# Patient Record
Sex: Male | Born: 1965
Health system: Southern US, Community
[De-identification: ages and names within clinical notes are randomized; demographics above are authoritative.]

## PROBLEM LIST (undated history)

## (undated) DIAGNOSIS — C801 Malignant (primary) neoplasm, unspecified: Secondary | ICD-10-CM

## (undated) DIAGNOSIS — R519 Headache, unspecified: Secondary | ICD-10-CM

## (undated) DIAGNOSIS — R51 Headache: Secondary | ICD-10-CM

## (undated) DIAGNOSIS — I10 Essential (primary) hypertension: Secondary | ICD-10-CM

## (undated) DIAGNOSIS — F419 Anxiety disorder, unspecified: Secondary | ICD-10-CM

## (undated) DIAGNOSIS — K219 Gastro-esophageal reflux disease without esophagitis: Secondary | ICD-10-CM

## (undated) DIAGNOSIS — K449 Diaphragmatic hernia without obstruction or gangrene: Secondary | ICD-10-CM

## (undated) HISTORY — DX: Essential (primary) hypertension: I10

## (undated) HISTORY — PX: APPENDECTOMY: SHX54

---

## 2014-03-17 ENCOUNTER — Encounter (HOSPITAL_COMMUNITY): Payer: Self-pay | Admitting: *Deleted

## 2014-03-17 DIAGNOSIS — R0602 Shortness of breath: Secondary | ICD-10-CM | POA: Diagnosis not present

## 2014-03-17 DIAGNOSIS — R030 Elevated blood-pressure reading, without diagnosis of hypertension: Secondary | ICD-10-CM | POA: Insufficient documentation

## 2014-03-17 DIAGNOSIS — Z87891 Personal history of nicotine dependence: Secondary | ICD-10-CM | POA: Insufficient documentation

## 2014-03-17 DIAGNOSIS — R110 Nausea: Secondary | ICD-10-CM | POA: Diagnosis present

## 2014-03-17 DIAGNOSIS — E876 Hypokalemia: Secondary | ICD-10-CM | POA: Diagnosis not present

## 2014-03-17 NOTE — ED Notes (Signed)
Took his BP this morning and it was high.  He is not being treated for this.  He also has had some mild nausea and thinks he may have been exposed to something at work.  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

## 2014-03-18 ENCOUNTER — Emergency Department (HOSPITAL_COMMUNITY)
Admission: EM | Admit: 2014-03-18 | Discharge: 2014-03-18 | Disposition: A | Discharge status: Home / Self Care | Payer: BC Managed Care – PPO | Attending: Emergency Medicine | Admitting: Emergency Medicine

## 2014-03-18 DIAGNOSIS — E876 Hypokalemia: Secondary | ICD-10-CM

## 2014-03-18 DIAGNOSIS — IMO0001 Reserved for inherently not codable concepts without codable children: Secondary | ICD-10-CM

## 2014-03-18 DIAGNOSIS — R030 Elevated blood-pressure reading, without diagnosis of hypertension: Secondary | ICD-10-CM

## 2014-03-18 LAB — CBC WITH DIFFERENTIAL/PLATELET
Basophils Absolute: 0 10*3/uL (ref 0.0–0.1)
Basophils Relative: 0 % (ref 0–1)
Eosinophils Absolute: 0 10*3/uL (ref 0.0–0.7)
Eosinophils Relative: 0 % (ref 0–5)
HEMATOCRIT: 46 % (ref 39.0–52.0)
HEMOGLOBIN: 16.6 g/dL (ref 13.0–17.0)
LYMPHS PCT: 23 % (ref 12–46)
Lymphs Abs: 1.6 10*3/uL (ref 0.7–4.0)
MCH: 31.3 pg (ref 26.0–34.0)
MCHC: 36.1 g/dL — AB (ref 30.0–36.0)
MCV: 86.6 fL (ref 78.0–100.0)
MONOS PCT: 9 % (ref 3–12)
Monocytes Absolute: 0.6 10*3/uL (ref 0.1–1.0)
Neutro Abs: 5 10*3/uL (ref 1.7–7.7)
Neutrophils Relative %: 68 % (ref 43–77)
Platelets: 221 10*3/uL (ref 150–400)
RBC: 5.31 MIL/uL (ref 4.22–5.81)
RDW: 12.5 % (ref 11.5–15.5)
WBC: 7.3 10*3/uL (ref 4.0–10.5)

## 2014-03-18 LAB — COMPREHENSIVE METABOLIC PANEL
ALT: 44 U/L (ref 0–53)
ANION GAP: 9 (ref 5–15)
AST: 45 U/L — ABNORMAL HIGH (ref 0–37)
Albumin: 4.2 g/dL (ref 3.5–5.2)
Alkaline Phosphatase: 83 U/L (ref 39–117)
BILIRUBIN TOTAL: 0.6 mg/dL (ref 0.3–1.2)
BUN: 16 mg/dL (ref 6–23)
CHLORIDE: 102 meq/L (ref 96–112)
CO2: 25 mmol/L (ref 19–32)
CREATININE: 1.01 mg/dL (ref 0.50–1.35)
Calcium: 9.3 mg/dL (ref 8.4–10.5)
GFR calc non Af Amer: 85 mL/min — ABNORMAL LOW (ref >90)
Glucose, Bld: 114 mg/dL — ABNORMAL HIGH (ref 70–99)
Potassium: 3.3 mmol/L — ABNORMAL LOW (ref 3.5–5.1)
Sodium: 136 mmol/L (ref 135–145)
Total Protein: 8.9 g/dL — ABNORMAL HIGH (ref 6.0–8.3)

## 2014-03-18 MED ORDER — POTASSIUM CHLORIDE CRYS ER 20 MEQ PO TBCR
40.0000 meq | EXTENDED_RELEASE_TABLET | Freq: Once | ORAL | Status: AC
  Administered 2014-03-18: 40 meq via ORAL
  Filled 2014-03-18: qty 2

## 2014-03-18 MED ORDER — LISINOPRIL 5 MG PO TABS
5.0000 mg | ORAL_TABLET | Freq: Once | ORAL | Status: AC
  Administered 2014-03-18: 5 mg via ORAL
  Filled 2014-03-18: qty 1

## 2014-03-18 MED ORDER — LISINOPRIL 5 MG PO TABS: 5.0000 mg | ORAL_TABLET | Freq: Every day | ORAL | Status: DC

## 2014-03-18 NOTE — Discharge Instructions (Signed)
You need to establish care with a primary doctor to monitor your blood pressure. Return immediately for persistent headache, visual changes, focal weakness, persistent vomiting, fever or for any concerns.   Hypertension Hypertension, commonly called high blood pressure, is when the force of blood pumping through your arteries is too strong. Your arteries are the blood vessels that carry blood from your heart throughout your body. A blood pressure reading consists of a higher number over a lower number, such as 110/72. The higher number (systolic) is the pressure inside your arteries when your heart pumps. The lower number (diastolic) is the pressure inside your arteries when your heart relaxes. Ideally you want your blood pressure below 120/80. Hypertension forces your heart to work harder to pump blood. Your arteries may become narrow or stiff. Having hypertension puts you at risk for heart disease, stroke, and other problems.  RISK FACTORS Some risk factors for high blood pressure are controllable. Others are not.  Risk factors you cannot control include:   Race. You may be at higher risk if you are African American.  Age. Risk increases with age.  Gender. Men are at higher risk than women before age 65 years. After age 96, women are at higher risk than men. Risk factors you can control include:  Not getting enough exercise or physical activity.  Being overweight.  Getting too much fat, sugar, calories, or salt in your diet.  Drinking too much alcohol. SIGNS AND SYMPTOMS Hypertension does not usually cause signs or symptoms. Extremely high blood pressure (hypertensive crisis) may cause headache, anxiety, shortness of breath, and nosebleed. DIAGNOSIS  To check if you have hypertension, your health care provider will measure your blood pressure while you are seated, with your arm held at the level of your heart. It should be measured at least twice using the same arm. Certain conditions  can cause a difference in blood pressure between your right and left arms. A blood pressure reading that is higher than normal on one occasion does not mean that you need treatment. If one blood pressure reading is high, ask your health care provider about having it checked again. TREATMENT  Treating high blood pressure includes making lifestyle changes and possibly taking medicine. Living a healthy lifestyle can help lower high blood pressure. You may need to change some of your habits. Lifestyle changes may include:  Following the DASH diet. This diet is high in fruits, vegetables, and whole grains. It is low in salt, red meat, and added sugars.  Getting at least 2 hours of brisk physical activity every week.  Losing weight if necessary.  Not smoking.  Limiting alcoholic beverages.  Learning ways to reduce stress. If lifestyle changes are not enough to get your blood pressure under control, your health care provider may prescribe medicine. You may need to take more than one. Work closely with your health care provider to understand the risks and benefits. HOME CARE INSTRUCTIONS  Have your blood pressure rechecked as directed by your health care provider.   Take medicines only as directed by your health care provider. Follow the directions carefully. Blood pressure medicines must be taken as prescribed. The medicine does not work as well when you skip doses. Skipping doses also puts you at risk for problems.   Do not smoke.   Monitor your blood pressure at home as directed by your health care provider. SEEK MEDICAL CARE IF:   You think you are having a reaction to medicines taken.  You have  recurrent headaches or feel dizzy.  You have swelling in your ankles.  You have trouble with your vision. SEEK IMMEDIATE MEDICAL CARE IF:  You develop a severe headache or confusion.  You have unusual weakness, numbness, or feel faint.  You have severe chest or abdominal  pain.  You vomit repeatedly.  You have trouble breathing. MAKE SURE YOU:   Understand these instructions.  Will watch your condition.  Will get help right away if you are not doing well or get worse. Document Released: 03/09/2005 Document Revised: 07/24/2013 Document Reviewed: 12/30/2012 [facility] Patient Information 2015 [place], [place]. This information is not intended to replace advice given to you by your health care provider. Make sure you discuss any questions you have with your health care provider.  Emergency Department Resource Guide 1) Find a Doctor and Pay Out of Pocket Although you won't have to find out who is covered by your insurance plan, it is a good idea to ask around and get recommendations. You will then need to call the office and see if the doctor you have chosen will accept you as a new patient and what types of options they offer for patients who are self-pay. Some doctors offer discounts or will set up payment plans for their patients who do not have insurance, but you will need to ask so you aren't surprised when you get to your appointment.  2) Contact Your Local Health Department Not all health departments have doctors that can see patients for sick visits, but many do, so it is worth a call to see if yours does. If you don't know where your local health department is, you can check in your phone book. The CDC also has a tool to help you locate your state's health department, and many state websites also have listings of all of their local health departments.  3) Find a [facility] If your illness is not likely to be very severe or complicated, you may want to try a walk in clinic. These are popping up all over the country in pharmacies, drugstores, and shopping centers. They're usually staffed by nurse practitioners or physician assistants that have been trained to treat common illnesses and complaints. They're usually fairly quick and inexpensive. However, if  you have serious medical issues or chronic medical problems, these are probably not your best option.  No Primary Care Doctor: - Call Health Connect at  [phone] - they can help you locate a primary care doctor that  accepts your insurance, provides certain services, etc. - Physician Referral Service- [phone]  Chronic Pain Problems: Organization         Address  Phone   Notes  [facility]  [phone] Patients need to be referred by their primary care doctor.   Medication Assistance: Organization         Address  Phone   Notes  [facility] Medication [facility] [address] [place], [place] [address] [phone] --Must be a resident of [facility] -- Must have NO insurance coverage whatsoever (no Medicaid/ Medicare, etc.) -- The pt. MUST have a primary care doctor that directs their care regularly and follows them in the community   [facility]ist  [phone]   [employer]  [phone]    Agencies that provide inexpensive medical care: Organization         Address  Phone   Notes  [place]  [phone]   [facility] Pontes  Internal Medicine    [phone]   [facility], [place] [address] [phone]   [place] [address] [place] [phone]   Planned Parenthood    [phone]   [facility]    [phone]   [place] [address], [place] Phone:  [phone], Fax:  [phone] Hours of Operation:  9 am - 6 pm, M-F.  Also accepts Medicaid/Medicare and self-pay.  [facility] for [place] [address], [address], [place] Phone: [phone], Fax: [phone]. Hours of Operation:  8:30 am - 5:30 pm, M-F.  Also accepts Medicaid and self-pay.  [facility] [address], [place] Phone: [phone]   [place], [place], [place] [phone], Ext. [phone] Mondays & Thursdays: 7-9 AM.  First 15 patients are seen on a first come, first serve basis.    [place] Providers:  Organization         Address  Phone   Notes  [facility] [address], [place] [phone] Also accepts self-pay patients.  [facility] [address], [place]  [phone]   [place], [address], [place] [phone]   [facility] [address] [place] [phone]   Lucianne Lei [address], [place]   [phone] Only accepts [place] Access [place] patients after they have their name applied to their card.   Self-Pay (no insurance) in [facility]:  Organization         Address  Phone   Notes  Sickle Cell Patients, [facility] [place] [phone]   [facility] [place] [phone]   [facility] [place]  [place], [place], [place] [phone]   [facility]/Dr. Osei-Bonsu  [address] [place] or [address], [place] [phone] Phone number for both [place] and Trail locations is the same.  [facility]gent [facility] and [facility] [address], [place] [phone]   [facility] [address], [place] or [address] [phone]   [facility] [address] [place] [phone], phone; [phone], fax Sees patients 1st and 3rd Saturday of every month.  Must not qualify for public or private insurance (i.e. Medicaid, Medicare, [place] Health Choice, Veterans' Benefits)  Household income should be no more than 200% of the poverty level The clinic cannot treat you if you are pregnant or think you are  pregnant  Sexually transmitted diseases are not treated at the clinic.    Dental Care: Organization         Address  Phone  Notes  [facility] Department of [facility] [place] [phone] Accepts children up to age 1 who are enrolled in [place] or [place]; pregnant women with a Medicaid card; and children who have applied for Medicaid or [place] Health Choice, but were declined, whose parents can pay a reduced fee at time of service.  [facility] Department of [facility]  [address], [employer] (  [phone] Accepts children up to age 23 who are enrolled in Medicaid or [place]; pregnant women with a Medicaid card; and children who have applied for Medicaid or [place] Health Choice, but were declined, whose parents can pay a reduced fee at time of service.  [place] Adult [facility] Access PROGRAM  [place] [phone] Patients are seen by appointment only. Walk-ins are not accepted. [place] will see patients 59 years of age and older. Monday - Tuesday (8am-5pm) Most Wednesdays (8:30-5pm) $30 per visit, cash only  [facility] Adult Dental Access PROGRAM  [address], [facility] [phone] Patients are seen by appointment only. Walk-ins are not accepted. [place] will see patients 65 years of age and older. One Wednesday Evening (Monthly: Volunteer Based).  $30 per visit, cash only  [facility]  [phone] for adults; Children under age 42, call Graduate Pediatric Dentistry at [phone]. Children aged 10-14, please call [phone] to request a pediatric application.  Dental services are provided in all areas of dental care including fillings, crowns and bridges, complete and partial dentures, implants, gum treatment, root canals, and extractions. Preventive care is also provided. Treatment is provided to both adults and  children. Patients are selected via a lottery and there is often a waiting list.   [facility] [address] [place]  [phone] [email or web address]   [facility] [address] [place], [place] [phone], Ext. [phone] Second and Fourth Thursday of each month, opens at 6:30 AM; Clinic ends at 9 AM.  Patients are seen on a first-come first-served basis, and a limited number are seen during each clinic.   [facility]  [address] Hill[place], [place] [phone]   Eligibility Requirements You must have lived in [place], [place], or [place] counties for at least the last three months.   You cannot be eligible for state or federal sponsored [employer], including [employer], [place], or [employer].   You generally cannot be eligible for healthcare insurance through your employer.    How to apply: Eligibility screenings are held every Tuesday and Wednesday afternoon from 1:00 pm until 4:00 pm. You do not need an appointment for the interview!  [facility] [address] [place], [place]   [place]  [place] Department  [place]  [phone]    Behavioral Health Resources in the Community: Intensive Outpatient Programs Organization         Address  Phone  Notes  [place] [place]. [address] [place], [place] [phone]   [facility] Outpatient [address], [place], [place]   ADS: Alcohol & Drug Svcs [address] [place], [place]   [place] [address],  [place], [place] or [phone]   Substance Abuse Resources Organization         Address  Phone  Notes  Alcohol and Drug Services  [phone]   [place]  [phone]   [facility] [place]    Chinita Pester  [phone]   Residential & Outpatient Substance Abuse Program  [phone]   Psychological Services Organization         Address  Phone  Notes  [place]  [place]  [phone]  [place] [address] [place] or [phone]    Mobile Crisis Teams Organization         Address  Phone  Notes  [facility], Mobile Crisis Care Unit  [phone]   [facility]  [address] [place], [place]   [place] [address] [place] [phone]    Self-Help/Support Groups Organization         Address  Phone             Notes  [place]. of [place] - variety of support groups  [place] Call for more information  Narcotics Anonymous (NA), [facility] [address], [employer] [place]  2 meetings at this location   Special educational needs teacher         Address  Phone  Notes  [facility]idential [facility] [place],    [place]  [phone]   [facility]  [address] [place] [phone], [place], [place]   [place], [place] [phone] Admissions: 8am-3pm M-F  Incentives Substance [place] [address]    [place], [place] [phone]   [facility] [address] [place], [place], [place]   [facility] [address]  [place], [place]   [facility] - Intensive Outpatient [address] [place]een [address], [place], [place]   [facility] ([place].,  [place], [place] [phone] or [phone]   [facility] (RTS) [address], [place], [place] Accepts Medicaid  [facility] [place] [address]  [place] [phone] Substance Abuse/Addiction Treatment   [facility] Organization         Address  Phone  Notes  [facility]  [phone]   Domenic Schwab, PhD [address] [place], [place]   [phone] or [phone]   [place], [place] [phone]   [facility] [address] [place], [place] [phone] Insurance/Medicaid/sponsorship through [facility] and Families [address]                                    [place], [place] [phone] [place] [address] [place], [place] [phone]    Dr. Adele Schilder  [phone]   [facility] Dept. 1) [address] [place] 2) [place] 3)  [place] [address], [place] [phone]  [phone]   [place] [phone] or [phone] (After Hours)

## 2014-03-18 NOTE — ED Provider Notes (Signed)
CSN: [id number]     Arrival date & time 03/17/14  2147 History   First MD Initiated Contact with Patient 03/18/14 0104     Chief Complaint  Patient presents with  . Hypertension  . Nausea     (Consider location/radiation/quality/duration/timing/severity/associated sxs/prior Treatment) HPI Patient presents with episodic nausea. No vomiting or diarrhea. States he took his blood pressure this morning and it was elevated. Denies headache, visual changes, focal weakness or numbness. He is on no medication for blood pressure control. He currently has no nausea. He denies chest pain or shortness of breath. History reviewed. No pertinent past medical history. History reviewed. No pertinent past surgical history. History reviewed. No pertinent family history. History  Substance Use Topics  . Smoking status: Former Research scientist (life sciences)  . Smokeless tobacco: Not on file  . Alcohol Use: No    Review of Systems  Constitutional: Negative for fever and chills.  HENT: Negative for congestion.   Eyes: Negative for visual disturbance.  Respiratory: Positive for shortness of breath. Negative for cough.   Cardiovascular: Negative for chest pain, palpitations and leg swelling.  Gastrointestinal: Positive for nausea. Negative for vomiting, abdominal pain, diarrhea and constipation.  Musculoskeletal: Negative for back pain, neck pain and neck stiffness.  Skin: Negative for rash and wound.  Neurological: Negative for dizziness, weakness, light-headedness, numbness and headaches.  All other systems reviewed and are negative.     Allergies  Review of patient's allergies indicates no known allergies.  Home Medications   Prior to Admission medications   Medication Sig Start Date End Date Taking? Authorizing Provider  lisinopril (PRINIVIL,ZESTRIL) 5 MG tablet Take 1 tablet (5 mg total) by mouth daily. 03/18/14   Julianne Rice, MD   BP 172/110 mmHg  Pulse 99  Temp(Src) 98.2 F (36.8 C) (Oral)  Resp 16  Ht  5\' 9"  (1.753 m)  Wt 160 lb (72.576 kg)  BMI 23.62 kg/m2  SpO2 100% Physical Exam  Constitutional: He is oriented to person, place, and time. He appears well-developed and well-nourished. No distress.  HENT:  Head: Normocephalic and atraumatic.  Mouth/Throat: Oropharynx is clear and moist.  Eyes: EOM are normal. Pupils are equal, round, and reactive to light.  Neck: Normal range of motion. Neck supple.  Cardiovascular: Normal rate and regular rhythm.   Pulmonary/Chest: Effort normal and breath sounds normal. No respiratory distress. He has no wheezes. He has no rales. He exhibits no tenderness.  Abdominal: Soft. Bowel sounds are normal. He exhibits no distension and no mass. There is no tenderness. There is no rebound and no guarding.  Musculoskeletal: Normal range of motion. He exhibits no edema or tenderness.  Neurological: He is alert and oriented to person, place, and time.  Patient is alert and oriented x3 with clear, goal oriented speech. Patient has 5/5 motor in all extremities. Sensation is intact to light touch. Bilateral finger-to-nose is normal with no signs of dysmetria. Patient has a normal gait and walks without assistance.  Skin: Skin is warm and dry. No rash noted. No erythema.  Psychiatric: He has a normal mood and affect. His behavior is normal.  Nursing note and vitals reviewed.   ED Course  Procedures (including critical care time) Labs Review Labs Reviewed  CBC WITH DIFFERENTIAL - Abnormal; Notable for the following:    MCHC 36.1 (*)    All other components within normal limits  COMPREHENSIVE METABOLIC PANEL - Abnormal; Notable for the following:    Potassium 3.3 (*)    Glucose, Bld 114 (*)  Total Protein 8.9 (*)    AST 45 (*)    GFR calc non Af Amer 85 (*)    All other components within normal limits    Imaging Review No results found.   EKG Interpretation None      MDM   Final diagnoses:  Elevated blood pressure  Hypokalemia    Mild  hypokalemia. Labs otherwise unremarkable. We'll start on low-dose lisinopril and have advised he establish care with primary doctor. He's been given return precautions and is voiced understanding.    Julianne Rice, MD 03/18/14 [phone]

## 2014-08-09 ENCOUNTER — Emergency Department (HOSPITAL_COMMUNITY)
Admission: EM | Admit: 2014-08-09 | Discharge: 2014-08-09 | Disposition: A | Discharge status: Home / Self Care | Payer: BLUE CROSS/BLUE SHIELD | Attending: Emergency Medicine | Admitting: Emergency Medicine

## 2014-08-09 ENCOUNTER — Emergency Department (HOSPITAL_COMMUNITY): Payer: BLUE CROSS/BLUE SHIELD

## 2014-08-09 ENCOUNTER — Encounter (HOSPITAL_COMMUNITY): Payer: Self-pay | Admitting: *Deleted

## 2014-08-09 DIAGNOSIS — R1013 Epigastric pain: Secondary | ICD-10-CM | POA: Diagnosis present

## 2014-08-09 DIAGNOSIS — Z79899 Other long term (current) drug therapy: Secondary | ICD-10-CM | POA: Diagnosis not present

## 2014-08-09 DIAGNOSIS — Z87891 Personal history of nicotine dependence: Secondary | ICD-10-CM | POA: Diagnosis not present

## 2014-08-09 DIAGNOSIS — N200 Calculus of kidney: Secondary | ICD-10-CM | POA: Insufficient documentation

## 2014-08-09 DIAGNOSIS — I10 Essential (primary) hypertension: Secondary | ICD-10-CM | POA: Diagnosis not present

## 2014-08-09 DIAGNOSIS — R109 Unspecified abdominal pain: Secondary | ICD-10-CM

## 2014-08-09 LAB — COMPREHENSIVE METABOLIC PANEL
ALT: 65 U/L — ABNORMAL HIGH (ref 17–63)
ANION GAP: 10 (ref 5–15)
AST: 52 U/L — AB (ref 15–41)
Albumin: 4.4 g/dL (ref 3.5–5.0)
Alkaline Phosphatase: 71 U/L (ref 38–126)
BILIRUBIN TOTAL: 0.7 mg/dL (ref 0.3–1.2)
BUN: 9 mg/dL (ref 6–20)
CALCIUM: 9.4 mg/dL (ref 8.9–10.3)
CO2: 24 mmol/L (ref 22–32)
CREATININE: 1.06 mg/dL (ref 0.61–1.24)
Chloride: 104 mmol/L (ref 101–111)
GFR calc non Af Amer: >60 mL/min (ref >60)
GLUCOSE: 140 mg/dL — AB (ref 65–99)
Potassium: 4 mmol/L (ref 3.5–5.1)
Sodium: 138 mmol/L (ref 135–145)
Total Protein: 8.7 g/dL — ABNORMAL HIGH (ref 6.5–8.1)

## 2014-08-09 LAB — CBC WITH DIFFERENTIAL/PLATELET
Basophils Absolute: 0 10*3/uL (ref 0.0–0.1)
Basophils Relative: 0 % (ref 0–1)
Eosinophils Absolute: 0 10*3/uL (ref 0.0–0.7)
Eosinophils Relative: 0 % (ref 0–5)
HCT: 47.2 % (ref 39.0–52.0)
Hemoglobin: 16.9 g/dL (ref 13.0–17.0)
LYMPHS ABS: 1 10*3/uL (ref 0.7–4.0)
LYMPHS PCT: 20 % (ref 12–46)
MCH: 31.2 pg (ref 26.0–34.0)
MCHC: 35.8 g/dL (ref 30.0–36.0)
MCV: 87.1 fL (ref 78.0–100.0)
MONO ABS: 0.3 10*3/uL (ref 0.1–1.0)
Monocytes Relative: 6 % (ref 3–12)
Neutro Abs: 3.8 10*3/uL (ref 1.7–7.7)
Neutrophils Relative %: 74 % (ref 43–77)
Platelets: 232 10*3/uL (ref 150–400)
RBC: 5.42 MIL/uL (ref 4.22–5.81)
RDW: 12.3 % (ref 11.5–15.5)
WBC: 5.2 10*3/uL (ref 4.0–10.5)

## 2014-08-09 LAB — LIPASE, BLOOD: Lipase: 29 U/L (ref 22–51)

## 2014-08-09 MED ORDER — FAMOTIDINE 20 MG PO TABS
20.0000 mg | ORAL_TABLET | Freq: Once | ORAL | Status: AC
  Administered 2014-08-09: 20 mg via ORAL
  Filled 2014-08-09: qty 1

## 2014-08-09 MED ORDER — GI COCKTAIL ~~LOC~~
30.0000 mL | Freq: Once | ORAL | Status: AC
  Administered 2014-08-09: 30 mL via ORAL
  Filled 2014-08-09: qty 30

## 2014-08-09 MED ORDER — SUCRALFATE 1 G PO TABS: 1.0000 g | ORAL_TABLET | Freq: Three times a day (TID) | ORAL | Status: DC

## 2014-08-09 MED ORDER — RANITIDINE HCL 150 MG PO CAPS: 150.0000 mg | ORAL_CAPSULE | Freq: Two times a day (BID) | ORAL | Status: DC

## 2014-08-09 NOTE — ED Notes (Signed)
Pt back in room.

## 2014-08-09 NOTE — ED Provider Notes (Signed)
CSN: [id number]     Arrival date & time 08/09/14  1255 History   First MD Initiated Contact with Patient 08/09/14 1523     Chief Complaint  Patient presents with  . Abdominal Pain     (Consider location/radiation/quality/duration/timing/severity/associated sxs/prior Treatment) HPI Comments: The patient is a 49 year old male, who presents to the hospital with a complaint of approximately 1 week of abdominal bloating and nausea. He denies any significant pain but feels uncomfortable in the epigastrium with a feeling that radiates up into his neck, worse when he lays down, worse at night and in the morning, better when he stands up and walks. He does not have any pain that radiates to his back, he does not have any swelling of the lower extremities and denies fevers chills diarrhea rashes numbness weakness chest pain coughing or shortness of breath. He has a history of using ibuprofen frequently, currently he is using approximately 4-800 mg of ibuprofen once or twice a day. He does not take any medications for his stomach other than Pepto-Bismol when he feels bad which she states makes her feel better. He has never had any abdominal surgery  Patient is a 49 y.o. male presenting with abdominal pain. The history is provided by the patient.  Abdominal Pain   Past Medical History  Diagnosis Date  . Hypertension    Past Surgical History  Procedure Laterality Date  . Appendectomy     Family History  Problem Relation Age of Onset  . Hypertension Mother   . Hypertension Father    History  Substance Use Topics  . Smoking status: Former Research scientist (life sciences)  . Smokeless tobacco: Not on file  . Alcohol Use: No    Review of Systems  Gastrointestinal: Positive for abdominal pain.  All other systems reviewed and are negative.     Allergies  Hydrocodone  Home Medications   Prior to Admission medications   Medication Sig Start Date End Date Taking? Authorizing Provider  bismuth subsalicylate (PEPTO  BISMOL) 262 MG/15ML suspension Take 30 mLs by mouth every 6 (six) hours as needed for diarrhea or loose stools.   Yes Historical Provider, MD  ibuprofen (ADVIL,MOTRIN) 200 MG tablet Take 200 mg by mouth every 6 (six) hours as needed for mild pain or moderate pain.   Yes Historical Provider, MD  Ibuprofen-Diphenhydramine HCl 200-25 MG CAPS Take 1-2 capsules by mouth once as needed (for pain/sleep).   Yes Historical Provider, MD  lisinopril (PRINIVIL,ZESTRIL) 10 MG tablet Take 10 mg by mouth daily. 08/07/14  Yes Historical Provider, MD  ranitidine (ZANTAC) 150 MG capsule Take 1 capsule (150 mg total) by mouth 2 (two) times daily. 08/09/14   Noemi Chapel, MD  sucralfate (CARAFATE) 1 G tablet Take 1 tablet (1 g total) by mouth 4 (four) times daily -  with meals and at bedtime. 08/09/14   Noemi Chapel, MD   BP 146/100 mmHg  Pulse 92  Temp(Src) 98 F (36.7 C) (Oral)  Resp 14  Ht 5\' 9"  (1.753 m)  Wt 155 lb (70.308 kg)  BMI 22.88 kg/m2  SpO2 98% Physical Exam  Constitutional: He appears well-developed and well-nourished. No distress.  HENT:  Head: Normocephalic and atraumatic.  Mouth/Throat: Oropharynx is clear and moist. No oropharyngeal exudate.  Eyes: Conjunctivae and EOM are normal. Pupils are equal, round, and reactive to light. Right eye exhibits no discharge. Left eye exhibits no discharge. No scleral icterus.  Neck: Normal range of motion. Neck supple. No JVD present. No thyromegaly present.  Cardiovascular: Normal rate, regular rhythm, normal heart sounds and intact distal pulses.  Exam reveals no gallop and no friction rub.   No murmur heard. Pulmonary/Chest: Effort normal and breath sounds normal. No respiratory distress. He has no wheezes. He has no rales.  Abdominal: Soft. Bowel sounds are normal. He exhibits no distension and no mass. There is no tenderness.  No abdominal tenderness, no tympanitic sounds to percussion, no abdominal distention  Musculoskeletal: Normal range of motion.  He exhibits no edema or tenderness.  Lymphadenopathy:    He has no cervical adenopathy.  Neurological: He is alert. Coordination normal.  Skin: Skin is warm and dry. No rash noted. No erythema.  Psychiatric: He has a normal mood and affect. His behavior is normal.  Nursing note and vitals reviewed.   ED Course  Procedures (including critical care time) Labs Review Labs Reviewed  COMPREHENSIVE METABOLIC PANEL - Abnormal; Notable for the following:    Glucose, Bld 140 (*)    Total Protein 8.7 (*)    AST 52 (*)    ALT 65 (*)    All other components within normal limits  CBC WITH DIFFERENTIAL/PLATELET  LIPASE, BLOOD    Imaging Review Dg Abd 2 Views  08/09/2014   CLINICAL DATA:  Mid upper abdominal pain for 5 days.  EXAM: ABDOMEN - 2 VIEW  COMPARISON:  None.  FINDINGS: Negative for free air on the upright view. Nonobstructive bowel gas pattern with gas and stool in the colon. There is a cluster of calcifications in the right abdomen which probably represents gallstones. Calcifications are also in the region of the right kidney. This calcification cluster measures up to 1.8 cm.  IMPRESSION: Cluster of calcifications in the right abdomen, probably representing gallstones.  Nonobstructive bowel gas pattern. Moderate amount of stool in the right colon.   Electronically Signed   By: Markus Daft M.D.   On: 08/09/2014 16:49   US Abdomen Limited Ruq  08/09/2014   CLINICAL DATA:  Abdominal pain for 5 days. Possible gallstones seen on radiograph.  EXAM: US ABDOMEN LIMITED - RIGHT UPPER QUADRANT  COMPARISON:  Abdominal radiograph - earlier same day  FINDINGS: Gallbladder:  Sonographically normal. No echogenic gallstones or gall sludge. No gallbladder wall thickening or pericholecystic fluid. Negative sonographic Murphy's sign.  Common bile duct:  Diameter: Normal in size measuring 2.3 mm in diameter  Liver:  Homogeneous hepatic echotexture. No discrete hepatic lesions. No definite evidence of intrahepatic  biliary ductal dilatation. No ascites.  Instilling noted is an approximately 2.0 x 1.7 x 1.2 cm echogenic shadowing stone within the incidentally imaged superior/mid aspect of the right kidney (images 21 and 39).  IMPRESSION: 1. No evidence of cholelithiasis. 2. Suspected approximately 2.0 cm echogenic shadowing stone within the superior/mid aspect of the right kidney - this stone correlates with the stone seen on preceding abdominal radiograph. Further evaluation with noncontrast abdominal CT could be performed as clinically indicated.   Electronically Signed   By: Sandi Mariscal M.D.   On: 08/09/2014 17:56      MDM   Final diagnoses:  Abdominal pain  Dyspepsia  Kidney stone on right side    The patient's blood work shows a slight elevation in his transaminases, no leukocytosis, and lipase, 2 view abdomen, GI cocktail as this does sound to be more acid-related, dyspepsia with acid reflux most likely answer. Discouraged use of NSAIDs, reevaluate after labs have resulted.  He also reports using magnesium citrate to move his bowels recently -  no constipation.   Labs unremarkable  xrays reviewed, [place] reviewed, no acute findings - gas in bowels, no obstruction, no sig stool collection  Stable for d/c.  Meds given in ED:  Medications  gi cocktail (Maalox,Lidocaine,Donnatal) (30 mLs Oral Given 08/09/14 1551)  famotidine (PEPCID) tablet 20 mg (20 mg Oral Given 08/09/14 1550)    New Prescriptions   RANITIDINE (ZANTAC) 150 MG CAPSULE    Take 1 capsule (150 mg total) by mouth 2 (two) times daily.   SUCRALFATE (CARAFATE) 1 G TABLET    Take 1 tablet (1 g total) by mouth 4 (four) times daily -  with meals and at bedtime.      Noemi Chapel, MD 08/09/14 [id number]

## 2014-08-09 NOTE — Discharge Instructions (Signed)
Please call your doctor for a followup appointment within 24-48 hours. When you talk to your doctor please let them know that you were seen in the emergency department and have them acquire all of your records so that they can discuss the findings with you and formulate a treatment plan to fully care for your new and ongoing problems. ° °

## 2014-08-09 NOTE — ED Notes (Signed)
abd pain, nausea, difficulty swallowing.

## 2014-08-22 ENCOUNTER — Emergency Department (HOSPITAL_COMMUNITY)
Admission: EM | Admit: 2014-08-22 | Discharge: 2014-08-22 | Disposition: A | Discharge status: Home / Self Care | Payer: BLUE CROSS/BLUE SHIELD | Attending: Emergency Medicine | Admitting: Emergency Medicine

## 2014-08-22 ENCOUNTER — Emergency Department (HOSPITAL_COMMUNITY)
Admission: EM | Admit: 2014-08-22 | Discharge: 2014-08-23 | Disposition: A | Discharge status: Home / Self Care | Payer: BLUE CROSS/BLUE SHIELD | Attending: Emergency Medicine | Admitting: Emergency Medicine

## 2014-08-22 ENCOUNTER — Encounter (HOSPITAL_COMMUNITY): Payer: Self-pay | Admitting: Emergency Medicine

## 2014-08-22 ENCOUNTER — Encounter (HOSPITAL_COMMUNITY): Payer: Self-pay | Admitting: *Deleted

## 2014-08-22 ENCOUNTER — Emergency Department (HOSPITAL_COMMUNITY): Payer: BLUE CROSS/BLUE SHIELD

## 2014-08-22 DIAGNOSIS — R1013 Epigastric pain: Secondary | ICD-10-CM | POA: Diagnosis present

## 2014-08-22 DIAGNOSIS — K2970 Gastritis, unspecified, without bleeding: Secondary | ICD-10-CM | POA: Diagnosis not present

## 2014-08-22 DIAGNOSIS — R1010 Upper abdominal pain, unspecified: Secondary | ICD-10-CM | POA: Diagnosis present

## 2014-08-22 DIAGNOSIS — I10 Essential (primary) hypertension: Secondary | ICD-10-CM | POA: Diagnosis not present

## 2014-08-22 DIAGNOSIS — Z87891 Personal history of nicotine dependence: Secondary | ICD-10-CM | POA: Diagnosis not present

## 2014-08-22 DIAGNOSIS — Z79899 Other long term (current) drug therapy: Secondary | ICD-10-CM | POA: Diagnosis not present

## 2014-08-22 DIAGNOSIS — K449 Diaphragmatic hernia without obstruction or gangrene: Secondary | ICD-10-CM | POA: Insufficient documentation

## 2014-08-22 DIAGNOSIS — R109 Unspecified abdominal pain: Secondary | ICD-10-CM

## 2014-08-22 LAB — CBC WITH DIFFERENTIAL/PLATELET
BASOS ABS: 0 10*3/uL (ref 0.0–0.1)
BASOS PCT: 0 % (ref 0–1)
Basophils Absolute: 0 10*3/uL (ref 0.0–0.1)
Basophils Relative: 0 % (ref 0–1)
EOS ABS: 0 10*3/uL (ref 0.0–0.7)
EOS PCT: 0 % (ref 0–5)
Eosinophils Absolute: 0 10*3/uL (ref 0.0–0.7)
Eosinophils Relative: 0 % (ref 0–5)
HCT: 45.1 % (ref 39.0–52.0)
HCT: 46.3 % (ref 39.0–52.0)
Hemoglobin: 15.8 g/dL (ref 13.0–17.0)
Hemoglobin: 16.4 g/dL (ref 13.0–17.0)
LYMPHS PCT: 17 % (ref 12–46)
Lymphocytes Relative: 17 % (ref 12–46)
Lymphs Abs: 1.2 10*3/uL (ref 0.7–4.0)
Lymphs Abs: 1.3 10*3/uL (ref 0.7–4.0)
MCH: 30.6 pg (ref 26.0–34.0)
MCH: 31 pg (ref 26.0–34.0)
MCHC: 35 g/dL (ref 30.0–36.0)
MCHC: 35.4 g/dL (ref 30.0–36.0)
MCV: 87.2 fL (ref 78.0–100.0)
MCV: 87.5 fL (ref 78.0–100.0)
Monocytes Absolute: 0.6 10*3/uL (ref 0.1–1.0)
Monocytes Absolute: 0.9 10*3/uL (ref 0.1–1.0)
Monocytes Relative: 8 % (ref 3–12)
Monocytes Relative: 11 % (ref 3–12)
NEUTROS PCT: 75 % (ref 43–77)
Neutro Abs: 5.2 10*3/uL (ref 1.7–7.7)
Neutro Abs: 5.4 10*3/uL (ref 1.7–7.7)
Neutrophils Relative %: 72 % (ref 43–77)
PLATELETS: 216 10*3/uL (ref 150–400)
PLATELETS: 222 10*3/uL (ref 150–400)
RBC: 5.17 MIL/uL (ref 4.22–5.81)
RBC: 5.29 MIL/uL (ref 4.22–5.81)
RDW: 12.3 % (ref 11.5–15.5)
RDW: 12.4 % (ref 11.5–15.5)
WBC: 7 10*3/uL (ref 4.0–10.5)
WBC: 7.6 10*3/uL (ref 4.0–10.5)

## 2014-08-22 LAB — COMPREHENSIVE METABOLIC PANEL
ALBUMIN: 4.4 g/dL (ref 3.5–5.0)
ALK PHOS: 71 U/L (ref 38–126)
ALT: 53 U/L (ref 17–63)
ALT: 58 U/L (ref 17–63)
ANION GAP: 10 (ref 5–15)
AST: 47 U/L — ABNORMAL HIGH (ref 15–41)
AST: 45 U/L — ABNORMAL HIGH (ref 15–41)
Albumin: 4.3 g/dL (ref 3.5–5.0)
Alkaline Phosphatase: 82 U/L (ref 38–126)
Anion gap: 10 (ref 5–15)
BILIRUBIN TOTAL: 0.7 mg/dL (ref 0.3–1.2)
BUN: 12 mg/dL (ref 6–20)
BUN: 12 mg/dL (ref 6–20)
CALCIUM: 9.5 mg/dL (ref 8.9–10.3)
CHLORIDE: 101 mmol/L (ref 101–111)
CO2: 26 mmol/L (ref 22–32)
CO2: 27 mmol/L (ref 22–32)
Calcium: 9.5 mg/dL (ref 8.9–10.3)
Chloride: 102 mmol/L (ref 101–111)
Creatinine, Ser: 1.02 mg/dL (ref 0.61–1.24)
Creatinine, Ser: 1.14 mg/dL (ref 0.61–1.24)
GFR calc Af Amer: >60 mL/min (ref >60)
GFR calc Af Amer: >60 mL/min (ref >60)
GFR calc non Af Amer: >60 mL/min (ref >60)
GLUCOSE: 116 mg/dL — AB (ref 65–99)
Glucose, Bld: 124 mg/dL — ABNORMAL HIGH (ref 65–99)
Potassium: 3.6 mmol/L (ref 3.5–5.1)
Potassium: 3.7 mmol/L (ref 3.5–5.1)
SODIUM: 138 mmol/L (ref 135–145)
SODIUM: 138 mmol/L (ref 135–145)
TOTAL PROTEIN: 8.5 g/dL — AB (ref 6.5–8.1)
Total Bilirubin: 0.8 mg/dL (ref 0.3–1.2)
Total Protein: 8.3 g/dL — ABNORMAL HIGH (ref 6.5–8.1)

## 2014-08-22 LAB — LIPASE, BLOOD
LIPASE: 30 U/L (ref 22–51)
Lipase: 31 U/L (ref 22–51)

## 2014-08-22 LAB — URINALYSIS, ROUTINE W REFLEX MICROSCOPIC
Bilirubin Urine: NEGATIVE
GLUCOSE, UA: NEGATIVE mg/dL
HGB URINE DIPSTICK: NEGATIVE
Leukocytes, UA: NEGATIVE
Nitrite: NEGATIVE
PH: 7 (ref 5.0–8.0)
Protein, ur: NEGATIVE mg/dL
Urobilinogen, UA: 0.2 mg/dL (ref 0.0–1.0)

## 2014-08-22 MED ORDER — GI COCKTAIL ~~LOC~~
30.0000 mL | Freq: Once | ORAL | Status: AC
  Administered 2014-08-22: 30 mL via ORAL
  Filled 2014-08-22: qty 30

## 2014-08-22 MED ORDER — SODIUM CHLORIDE 0.9 % IV SOLN
1000.0000 mL | Freq: Once | INTRAVENOUS | Status: AC
  Administered 2014-08-22: 1000 mL via INTRAVENOUS

## 2014-08-22 MED ORDER — ONDANSETRON 4 MG PO TBDP: 4.0000 mg | ORAL_TABLET | Freq: Three times a day (TID) | ORAL | Status: DC | PRN

## 2014-08-22 MED ORDER — IOHEXOL 300 MG/ML  SOLN
50.0000 mL | Freq: Once | INTRAMUSCULAR | Status: AC | PRN
  Administered 2014-08-22: 50 mL via ORAL

## 2014-08-22 MED ORDER — SODIUM CHLORIDE 0.9 % IV SOLN
1000.0000 mL | INTRAVENOUS | Status: DC
  Administered 2014-08-22: 1000 mL via INTRAVENOUS

## 2014-08-22 MED ORDER — IOHEXOL 300 MG/ML  SOLN
100.0000 mL | Freq: Once | INTRAMUSCULAR | Status: AC | PRN
  Administered 2014-08-22: 100 mL via INTRAVENOUS

## 2014-08-22 MED ORDER — PANTOPRAZOLE SODIUM 40 MG IV SOLR
40.0000 mg | Freq: Once | INTRAVENOUS | Status: AC
  Administered 2014-08-22: 40 mg via INTRAVENOUS
  Filled 2014-08-22: qty 40

## 2014-08-22 MED ORDER — HYDROMORPHONE HCL 1 MG/ML IJ SOLN
1.0000 mg | Freq: Once | INTRAMUSCULAR | Status: AC
  Administered 2014-08-22: 1 mg via INTRAVENOUS
  Filled 2014-08-22: qty 1

## 2014-08-22 MED ORDER — ONDANSETRON HCL 4 MG/2ML IJ SOLN
4.0000 mg | Freq: Once | INTRAMUSCULAR | Status: AC
  Administered 2014-08-22: 4 mg via INTRAVENOUS
  Filled 2014-08-22: qty 2

## 2014-08-22 MED ORDER — PANTOPRAZOLE SODIUM 40 MG PO TBEC: 40.0000 mg | DELAYED_RELEASE_TABLET | Freq: Every day | ORAL | Status: DC

## 2014-08-22 MED ORDER — TRAMADOL HCL 50 MG PO TABS: 50.0000 mg | ORAL_TABLET | Freq: Four times a day (QID) | ORAL | Status: DC | PRN

## 2014-08-22 NOTE — Discharge Instructions (Signed)
Please avoid aspirin, ibuprofen, naproxen. He may take Tylenol for pain. Please avoid alcohol. Please follow up with gastroenterology.   Gastritis, Adult Gastritis is soreness and swelling (inflammation) of the lining of the stomach. Gastritis can develop as a sudden onset (acute) or long-term (chronic) condition. If gastritis is not treated, it can lead to stomach bleeding and ulcers. CAUSES  Gastritis occurs when the stomach lining is weak or damaged. Digestive juices from the stomach then inflame the weakened stomach lining. The stomach lining may be weak or damaged due to viral or bacterial infections. One common bacterial infection is the Helicobacter pylori infection. Gastritis can also result from excessive alcohol consumption, taking certain medicines, or having too much acid in the stomach.  SYMPTOMS  In some cases, there are no symptoms. When symptoms are present, they may include:  Pain or a burning sensation in the upper abdomen.  Nausea.  Vomiting.  An uncomfortable feeling of fullness after eating. DIAGNOSIS  Your caregiver may suspect you have gastritis based on your symptoms and a physical exam. To determine the cause of your gastritis, your caregiver may perform the following:  Blood or stool tests to check for the H pylori bacterium.  Gastroscopy. A thin, flexible tube (endoscope) is passed down the esophagus and into the stomach. The endoscope has a light and camera on the end. Your caregiver uses the endoscope to view the inside of the stomach.  Taking a tissue sample (biopsy) from the stomach to examine under a microscope. TREATMENT  Depending on the cause of your gastritis, medicines may be prescribed. If you have a bacterial infection, such as an H pylori infection, antibiotics may be given. If your gastritis is caused by too much acid in the stomach, H2 blockers or antacids may be given. Your caregiver may recommend that you stop taking aspirin, ibuprofen, or other  nonsteroidal anti-inflammatory drugs (NSAIDs). HOME CARE INSTRUCTIONS  Only take over-the-counter or prescription medicines as directed by your caregiver.  If you were given antibiotic medicines, take them as directed. Finish them even if you start to feel better.  Drink enough fluids to keep your urine clear or pale yellow.  Avoid foods and drinks that make your symptoms worse, such as:  Caffeine or alcoholic drinks.  Chocolate.  Peppermint or mint flavorings.  Garlic and onions.  Spicy foods.  Citrus fruits, such as oranges, lemons, or limes.  Tomato-based foods such as sauce, chili, salsa, and pizza.  Fried and fatty foods.  Eat small, frequent meals instead of large meals. SEEK IMMEDIATE MEDICAL CARE IF:   You have black or dark red stools.  You vomit blood or material that looks like coffee grounds.  You are unable to keep fluids down.  Your abdominal pain gets worse.  You have a fever.  You do not feel better after 1 week.  You have any other questions or concerns. MAKE SURE YOU:  Understand these instructions.  Will watch your condition.  Will get help right away if you are not doing well or get worse. Document Released: 03/03/2001 Document Revised: 09/08/2011 Document Reviewed: 04/22/2011 [facility] Patient Information 2015 [place], [place]. This information is not intended to replace advice given to you by your health care provider. Make sure you discuss any questions you have with your health care provider.   Food Choices for Gastroesophageal Reflux Disease When you have gastroesophageal reflux disease (GERD), the foods you eat and your eating habits are very important. Choosing the right foods can help ease the  discomfort of GERD. WHAT GENERAL GUIDELINES DO I NEED TO FOLLOW?  Choose fruits, vegetables, whole grains, low-fat dairy products, and low-fat meat, fish, and poultry.  Limit fats such as oils, salad dressings, butter, nuts, and  avocado.  Keep a food diary to identify foods that cause symptoms.  Avoid foods that cause reflux. These may be different for different people.  Eat frequent small meals instead of three large meals each day.  Eat your meals slowly, in a relaxed setting.  Limit fried foods.  Cook foods using methods other than frying.  Avoid drinking alcohol.  Avoid drinking large amounts of liquids with your meals.  Avoid bending over or lying down until 2-3 hours after eating. WHAT FOODS ARE NOT RECOMMENDED? The following are some foods and drinks that may worsen your symptoms: Vegetables Tomatoes. Tomato juice. Tomato and spaghetti sauce. Chili peppers. Onion and garlic. Horseradish. Fruits Oranges, grapefruit, and lemon (fruit and juice). Meats High-fat meats, fish, and poultry. This includes hot dogs, ribs, ham, sausage, salami, and bacon. Dairy Whole milk and chocolate milk. Sour cream. Cream. Butter. Ice cream. Cream cheese.  Beverages Coffee and tea, with or without caffeine. Carbonated beverages or energy drinks. Condiments Hot sauce. Barbecue sauce.  Sweets/Desserts Chocolate and cocoa. Donuts. Peppermint and spearmint. Fats and Oils High-fat foods, including [place] fries and potato chips. Other Vinegar. Strong spices, such as black pepper, white pepper, red pepper, cayenne, curry powder, cloves, ginger, and chili powder. The items listed above may not be a complete list of foods and beverages to avoid. Contact your dietitian for more information. Document Released: 03/09/2005 Document Revised: 03/14/2013 Document Reviewed: 01/11/2013 [facility] Patient Information 2015 [place], [place]. This information is not intended to replace advice given to you by your health care provider. Make sure you discuss any questions you have with your health care provider.

## 2014-08-22 NOTE — ED Notes (Signed)
Pt still states that he feels like he has to urinate but does not feel that he can, bladder scan performed showing 408 ml urine, Dr Roderic Palau notified,

## 2014-08-22 NOTE — ED Provider Notes (Signed)
TIME SEEN: 3:45 AM  CHIEF COMPLAINT: Epigastric abdominal pain  HPI: Pt is a 49 y.o. male with history of hypertension who presents to the emergency Department with complaints of epigastric abdominal pain. Was seen in the emergency department on 5/19 and had unremarkable workup including a negative right upper quadrant ultrasound. States he is discharged on Carafate and with Zantac. States he ran out of these medications and his pain is returning. Denies chest pain or shortness of breath. No vomiting or diarrhea. Does not have a gastroenterologist. Has never had an endoscopy.  Blood per rectum or melena. States he did have his primary care provider, Dr. Collene Mares, refill his Carafate today and he has had 2 pills with some relief.  ROS: See HPI Constitutional: no fever  Eyes: no drainage  ENT: no runny nose   Cardiovascular:  no chest pain  Resp: no SOB  GI: no vomiting GU: no dysuria Integumentary: no rash  Allergy: no hives  Musculoskeletal: no leg swelling  Neurological: no slurred speech ROS otherwise negative  PAST MEDICAL HISTORY/PAST SURGICAL HISTORY:  Past Medical History  Diagnosis Date  . Hypertension     MEDICATIONS:  Prior to Admission medications   Medication Sig Start Date End Date Taking? Authorizing Provider  bismuth subsalicylate (PEPTO BISMOL) 262 MG/15ML suspension Take 30 mLs by mouth every 6 (six) hours as needed for diarrhea or loose stools.    Historical Provider, MD  ibuprofen (ADVIL,MOTRIN) 200 MG tablet Take 200 mg by mouth every 6 (six) hours as needed for mild pain or moderate pain.    Historical Provider, MD  Ibuprofen-Diphenhydramine HCl 200-25 MG CAPS Take 1-2 capsules by mouth once as needed (for pain/sleep).    Historical Provider, MD  lisinopril (PRINIVIL,ZESTRIL) 10 MG tablet Take 10 mg by mouth daily. 08/07/14   Historical Provider, MD  ranitidine (ZANTAC) 150 MG capsule Take 1 capsule (150 mg total) by mouth 2 (two) times daily. 08/09/14   Noemi Chapel,  MD  sucralfate (CARAFATE) 1 G tablet Take 1 tablet (1 g total) by mouth 4 (four) times daily -  with meals and at bedtime. 08/09/14   Noemi Chapel, MD    ALLERGIES:  Allergies  Allergen Reactions  . Hydrocodone Nausea And Vomiting    SOCIAL HISTORY:  History  Substance Use Topics  . Smoking status: Former Research scientist (life sciences)  . Smokeless tobacco: Not on file  . Alcohol Use: No    FAMILY HISTORY: Family History  Problem Relation Age of Onset  . Hypertension Mother   . Hypertension Father     EXAM: BP 160/104 mmHg  Pulse 96  Temp(Src) 98 F (36.7 C)  Resp 18  Ht 5\' 9"  (1.753 m)  Wt 162 lb (73.483 kg)  BMI 23.91 kg/m2  SpO2 99% CONSTITUTIONAL: Alert and oriented and responds appropriately to questions. Well-appearing; well-nourished HEAD: Normocephalic EYES: Conjunctivae clear, PERRL ENT: normal nose; no rhinorrhea; moist mucous membranes; pharynx without lesions noted NECK: Supple, no meningismus, no LAD  CARD: RRR; S1 and S2 appreciated; no murmurs, no clicks, no rubs, no gallops RESP: Normal chest excursion without splinting or tachypnea; breath sounds clear and equal bilaterally; no wheezes, no rhonchi, no rales, no hypoxia or respiratory distress, speaking full sentences ABD/GI: Normal bowel sounds; non-distended; soft, non-tender, no rebound, no guarding, no peritoneal signs BACK:  The back appears normal and is non-tender to palpation, there is no CVA tenderness EXT: Normal ROM in all joints; non-tender to palpation; no edema; normal capillary refill; no cyanosis, no  calf tenderness or swelling    SKIN: Normal color for age and race; warm NEURO: Moves all extremities equally, sensation to light touch intact diffusely, cranial nerves II through XII intact PSYCH: The patient's mood and manner are appropriate. Grooming and personal hygiene are appropriate.  MEDICAL DECISION MAKING: Patient here with epigastric pain concerning for gastritis. Reports relief initially with Zantac and  Carafate. We'll giv Protonix, GI cocktail and reassess. Labs unremarkable. LFTs normal. Lipase normal. Urine shows no sign of infection.    ED PROGRESS: Patient reports feeling better after Protonix and GI cocktail. Advised him to avoid NSAIDs and alcohol. We'll give him gastroenterology follow-up. I told him to stop his Zantac and start taking Protonix. Discussed return precautions. He verbalizes understanding and is comfortable with plan.     [place], DO 08/22/14 [phone]

## 2014-08-22 NOTE — ED Notes (Addendum)
Reported by CT that pt feels like he has to urinate but is unable to at this time, pt in CT currently

## 2014-08-22 NOTE — ED Notes (Signed)
Pt co abdominal pain x1 week, seen for same last night, pt states pain is 10/10 and can't sleep because of pain.

## 2014-08-22 NOTE — ED Notes (Signed)
Pt c/o abd pain and states he ran out of meds for ulcers.

## 2014-08-22 NOTE — ED Provider Notes (Signed)
CSN: [id number]     Arrival date & time 08/22/14  1909 History   First MD Initiated Contact with Patient 08/22/14 2031     Chief Complaint  Patient presents with  . Abdominal Pain   HPI Patient presents to the emergency room with complaints of persistent upper abdominal pain. Patient has been having trouble for at least the last week. He had been taking antiacids and Carafate that was prescribed by his doctor. Patient had noted good relief with that treatment. He stopped taking his medications on the Carafate ran out and over the next 4 days he started having recurrent symptoms of a nagging pain in his upper abdomen. Patient came to the emergency room on May 19 for similar symptoms as well as earlier this morning at about 1 AM. The patient had laboratory tests and abdominal ultrasounds are unremarkable. He was prescribed Carafate but does not feel like it is working. Past Medical History  Diagnosis Date  . Hypertension    Past Surgical History  Procedure Laterality Date  . Appendectomy     Family History  Problem Relation Age of Onset  . Hypertension Mother   . Hypertension Father    History  Substance Use Topics  . Smoking status: Former Research scientist (life sciences)  . Smokeless tobacco: Not on file  . Alcohol Use: No    Review of Systems  Respiratory: Negative for cough and shortness of breath.   Cardiovascular: Negative for chest pain.  Gastrointestinal: Positive for nausea. Negative for vomiting and diarrhea.  Genitourinary: Negative for dysuria.  All other systems reviewed and are negative.     Allergies  Hydrocodone  Home Medications   Prior to Admission medications   Medication Sig Start Date End Date Taking? Authorizing Provider  diphenhydramine-acetaminophen (TYLENOL PM) 25-500 MG TABS Take 1 tablet by mouth daily.   Yes Historical Provider, MD  lisinopril (PRINIVIL,ZESTRIL) 10 MG tablet Take 10 mg by mouth daily. 08/07/14  Yes Historical Provider, MD  ondansetron (ZOFRAN ODT) 4 MG  disintegrating tablet Take 1 tablet (4 mg total) by mouth every 8 (eight) hours as needed for nausea or vomiting. 08/22/14  Yes Kristen N Ward, DO  pantoprazole (PROTONIX) 40 MG tablet Take 1 tablet (40 mg total) by mouth daily. 08/22/14  Yes Kristen N Ward, DO  ranitidine (ZANTAC) 150 MG tablet Take 150 mg by mouth 2 (two) times daily. 08/21/14  Yes Historical Provider, MD  sucralfate (CARAFATE) 1 G tablet Take 1 tablet (1 g total) by mouth 4 (four) times daily -  with meals and at bedtime. 08/09/14  Yes Noemi Chapel, MD  bismuth subsalicylate (PEPTO BISMOL) 262 MG/15ML suspension Take 30 mLs by mouth every 6 (six) hours as needed for diarrhea or loose stools.    Historical Provider, MD  ibuprofen (ADVIL,MOTRIN) 200 MG tablet Take 200 mg by mouth every 6 (six) hours as needed for mild pain or moderate pain.    Historical Provider, MD  Ibuprofen-Diphenhydramine HCl 200-25 MG CAPS Take 1-2 capsules by mouth once as needed (for pain/sleep).    Historical Provider, MD   BP 164/109 mmHg  Pulse 106  Temp(Src) 98.9 F (37.2 C) (Oral)  Resp 20  Ht 5\' 9"  (1.753 m)  Wt 162 lb (73.483 kg)  BMI 23.91 kg/m2  SpO2 99% Physical Exam  Constitutional: He appears well-developed and well-nourished. No distress.  HENT:  Head: Normocephalic and atraumatic.  Right Ear: External ear normal.  Left Ear: External ear normal.  Eyes: Conjunctivae are normal. Right eye  exhibits no discharge. Left eye exhibits no discharge. No scleral icterus.  Neck: Neck supple. No tracheal deviation present.  Cardiovascular: Normal rate, regular rhythm and intact distal pulses.   Pulmonary/Chest: Effort normal and breath sounds normal. No stridor. No respiratory distress. He has no wheezes. He has no rales.  Abdominal: Soft. Bowel sounds are normal. He exhibits no distension. There is tenderness in the epigastric area. There is no rebound and no guarding.  Musculoskeletal: He exhibits no edema or tenderness.  Neurological: He is alert.  He has normal strength. No cranial nerve deficit (no facial droop, extraocular movements intact, no slurred speech) or sensory deficit. He exhibits normal muscle tone. He displays no seizure activity. Coordination normal.  Skin: Skin is warm and dry. No rash noted.  Psychiatric: He has a normal mood and affect.  Nursing note and vitals reviewed.   ED Course  Procedures (including critical care time) Labs Review Labs Reviewed  COMPREHENSIVE METABOLIC PANEL - Abnormal; Notable for the following:    Glucose, Bld 116 (*)    Total Protein 8.5 (*)    AST 45 (*)    All other components within normal limits  CBC WITH DIFFERENTIAL/PLATELET  LIPASE, BLOOD  URINALYSIS, ROUTINE W REFLEX MICROSCOPIC (NOT AT [facility])    Imaging Review No results found.   EKG Interpretation   Date/Time:  Wednesday August 22 2014 21:42:24 EDT Ventricular Rate:  96 PR Interval:  226 QRS Duration: 77 QT Interval:  382 QTC Calculation: 483 R Axis:   58 Text Interpretation:  Sinus rhythm Prolonged PR interval Borderline  prolonged QT interval Baseline wander in lead(s) V5 No old tracing to  compare Confirmed by Crews Mccollam  MD-J, Hena Ewalt ([id number]) on 08/22/2014 9:45:59 PM      MDM   Final diagnoses:  Abdominal pain    Will check CT considering his persistent symptoms.  Reviewed prior labs and previous abd [place].  If negative, plan on dc home,  Follow up GI.  Carafate and PPI    Dorie Rank, MD 08/22/14 2237

## 2014-08-22 NOTE — Discharge Instructions (Signed)
Follow up with dr. Oneida Alar the gi md as planned

## 2014-08-23 ENCOUNTER — Encounter (HOSPITAL_COMMUNITY): Payer: Self-pay | Admitting: *Deleted

## 2014-08-23 ENCOUNTER — Emergency Department (HOSPITAL_COMMUNITY)
Admission: EM | Admit: 2014-08-23 | Discharge: 2014-08-24 | Disposition: A | Discharge status: Home / Self Care | Payer: BLUE CROSS/BLUE SHIELD | Attending: Emergency Medicine | Admitting: Emergency Medicine

## 2014-08-23 DIAGNOSIS — Z87891 Personal history of nicotine dependence: Secondary | ICD-10-CM | POA: Diagnosis not present

## 2014-08-23 DIAGNOSIS — Z79899 Other long term (current) drug therapy: Secondary | ICD-10-CM | POA: Diagnosis not present

## 2014-08-23 DIAGNOSIS — K449 Diaphragmatic hernia without obstruction or gangrene: Secondary | ICD-10-CM | POA: Insufficient documentation

## 2014-08-23 DIAGNOSIS — R1010 Upper abdominal pain, unspecified: Secondary | ICD-10-CM | POA: Diagnosis present

## 2014-08-23 DIAGNOSIS — R140 Abdominal distension (gaseous): Secondary | ICD-10-CM | POA: Insufficient documentation

## 2014-08-23 DIAGNOSIS — I10 Essential (primary) hypertension: Secondary | ICD-10-CM | POA: Diagnosis not present

## 2014-08-23 DIAGNOSIS — K5900 Constipation, unspecified: Secondary | ICD-10-CM | POA: Diagnosis not present

## 2014-08-23 DIAGNOSIS — K219 Gastro-esophageal reflux disease without esophagitis: Secondary | ICD-10-CM | POA: Diagnosis not present

## 2014-08-23 LAB — URINALYSIS, ROUTINE W REFLEX MICROSCOPIC
Bilirubin Urine: NEGATIVE
Glucose, UA: NEGATIVE mg/dL
HGB URINE DIPSTICK: NEGATIVE
Ketones, ur: 40 mg/dL — AB
Nitrite: NEGATIVE
PH: 7 (ref 5.0–8.0)
PROTEIN: NEGATIVE mg/dL
SPECIFIC GRAVITY, URINE: 1.017 (ref 1.005–1.030)
UROBILINOGEN UA: 1 mg/dL (ref 0.0–1.0)

## 2014-08-23 LAB — CBC WITH DIFFERENTIAL/PLATELET
BASOS ABS: 0 10*3/uL (ref 0.0–0.1)
Basophils Relative: 0 % (ref 0–1)
EOS ABS: 0 10*3/uL (ref 0.0–0.7)
Eosinophils Relative: 0 % (ref 0–5)
HCT: 46.2 % (ref 39.0–52.0)
Hemoglobin: 16.5 g/dL (ref 13.0–17.0)
Lymphocytes Relative: 24 % (ref 12–46)
Lymphs Abs: 2 10*3/uL (ref 0.7–4.0)
MCH: 30.8 pg (ref 26.0–34.0)
MCHC: 35.7 g/dL (ref 30.0–36.0)
MCV: 86.4 fL (ref 78.0–100.0)
MONO ABS: 1 10*3/uL (ref 0.1–1.0)
MONOS PCT: 12 % (ref 3–12)
NEUTROS PCT: 64 % (ref 43–77)
Neutro Abs: 5.2 10*3/uL (ref 1.7–7.7)
Platelets: 223 10*3/uL (ref 150–400)
RBC: 5.35 MIL/uL (ref 4.22–5.81)
RDW: 12.4 % (ref 11.5–15.5)
WBC: 8.1 10*3/uL (ref 4.0–10.5)

## 2014-08-23 LAB — COMPREHENSIVE METABOLIC PANEL
ALBUMIN: 4.3 g/dL (ref 3.5–5.0)
ALK PHOS: 68 U/L (ref 38–126)
ALT: 61 U/L (ref 17–63)
AST: 49 U/L — AB (ref 15–41)
Anion gap: 10 (ref 5–15)
BILIRUBIN TOTAL: 0.7 mg/dL (ref 0.3–1.2)
BUN: 9 mg/dL (ref 6–20)
CHLORIDE: 100 mmol/L — AB (ref 101–111)
CO2: 28 mmol/L (ref 22–32)
Calcium: 9.5 mg/dL (ref 8.9–10.3)
Creatinine, Ser: 1.22 mg/dL (ref 0.61–1.24)
GFR calc non Af Amer: >60 mL/min (ref >60)
GLUCOSE: 119 mg/dL — AB (ref 65–99)
Potassium: 3.4 mmol/L — ABNORMAL LOW (ref 3.5–5.1)
SODIUM: 138 mmol/L (ref 135–145)
Total Protein: 8.6 g/dL — ABNORMAL HIGH (ref 6.5–8.1)

## 2014-08-23 LAB — URINE MICROSCOPIC-ADD ON

## 2014-08-23 LAB — LIPASE, BLOOD: Lipase: 26 U/L (ref 22–51)

## 2014-08-23 NOTE — ED Notes (Addendum)
Pt c/o constipation x 5-6 days. States he was seen at [facility] last night for the same and they sent pt home with prescriptions for constipation and a referral for GI. States that he has an appointment June 9th but he cannot tolerate the pain at home for a whole day. States he had a small BM this morning.

## 2014-08-23 NOTE — ED Provider Notes (Signed)
CSN: [id number]     Arrival date & time 08/23/14  2212 History   First MD Initiated Contact with Patient 08/23/14 2356     This chart was scribed for Linton Flemings, MD by Forrestine Him, ED Scribe. This patient was seen in room A02C/A02C and the patient's care was started 12:22 AM.   No chief complaint on file.  The history is provided by the patient. No language interpreter was used.    HPI Comments: Daniel Atkinson is a 49 y.o. male with a PMHx of HTN who presents to the Emergency Department complaining of constant, ongoing, unchanged constipation and abdominal bloating x 5-6 days. Pt also reports nausea but no episodes of vomiting since onset. Half a bottle of Magnesium Citrate attempted 2 days ago with no relief. Small amount of Mylanta attempted with no success. Last small bowel movement this morning. No recent fever or chills. Daniel Atkinson has a scheduled appointment with GI 6/9. [facility] ED for same on 6/1 at 3:45 AM and 20:30. CT performed at time of visit without any concerning findings. Pt with known allergy to Hydrocodone.  Past Medical History  Diagnosis Date  . Hypertension    Past Surgical History  Procedure Laterality Date  . Appendectomy     Family History  Problem Relation Age of Onset  . Hypertension Mother   . Hypertension Father    History  Substance Use Topics  . Smoking status: Former Research scientist (life sciences)  . Smokeless tobacco: Not on file  . Alcohol Use: No    Review of Systems  Constitutional: Negative for fever and chills.  Respiratory: Negative for cough.   Cardiovascular: Negative for chest pain.  Gastrointestinal: Positive for nausea, abdominal pain, constipation and abdominal distention. Negative for diarrhea.  Skin: Negative for rash.  Psychiatric/Behavioral: Negative for confusion.  All other systems reviewed and are negative.     Allergies  Hydrocodone  Home Medications   Prior to Admission medications   Medication Sig Start Date End  Date Taking? Authorizing Provider  bismuth subsalicylate (PEPTO BISMOL) 262 MG/15ML suspension Take 30 mLs by mouth every 6 (six) hours as needed for diarrhea or loose stools.    Historical Provider, MD  diphenhydramine-acetaminophen (TYLENOL PM) 25-500 MG TABS Take 1 tablet by mouth daily.    Historical Provider, MD  ibuprofen (ADVIL,MOTRIN) 200 MG tablet Take 200 mg by mouth every 6 (six) hours as needed for mild pain or moderate pain.    Historical Provider, MD  Ibuprofen-Diphenhydramine HCl 200-25 MG CAPS Take 1-2 capsules by mouth once as needed (for pain/sleep).    Historical Provider, MD  lisinopril (PRINIVIL,ZESTRIL) 10 MG tablet Take 10 mg by mouth daily. 08/07/14   Historical Provider, MD  ondansetron (ZOFRAN ODT) 4 MG disintegrating tablet Take 1 tablet (4 mg total) by mouth every 8 (eight) hours as needed for nausea or vomiting. 08/22/14   Kristen N Ward, DO  pantoprazole (PROTONIX) 40 MG tablet Take 1 tablet (40 mg total) by mouth daily. 08/22/14   Kristen N Ward, DO  ranitidine (ZANTAC) 150 MG tablet Take 150 mg by mouth 2 (two) times daily. 08/21/14   Historical Provider, MD  sucralfate (CARAFATE) 1 G tablet Take 1 tablet (1 g total) by mouth 4 (four) times daily -  with meals and at bedtime. 08/09/14   Noemi Chapel, MD  traMADol (ULTRAM) 50 MG tablet Take 1 tablet (50 mg total) by mouth every 6 (six) hours as needed. 08/22/14   Milton Ferguson,  MD   Triage Vitals: BP 162/104 mmHg  Pulse 88  Temp(Src) 98.4 F (36.9 C) (Oral)  Resp 20  SpO2 98%   Physical Exam  Constitutional: He is oriented to person, place, and time. He appears well-developed and well-nourished.  HENT:  Head: Normocephalic and atraumatic.  Nose: Nose normal.  Mouth/Throat: Oropharynx is clear and moist.  Eyes: Conjunctivae and EOM are normal. Pupils are equal, round, and reactive to light.  Neck: Normal range of motion. Neck supple. No JVD present. No tracheal deviation present. No thyromegaly present.   Cardiovascular: Normal rate, regular rhythm, normal heart sounds and intact distal pulses.  Exam reveals no gallop and no friction rub.   No murmur heard. Pulmonary/Chest: Effort normal and breath sounds normal. No stridor. No respiratory distress. He has no wheezes. He has no rales. He exhibits no tenderness.  Abdominal: Soft. Bowel sounds are normal. He exhibits no distension and no mass. There is tenderness (diffuse abdominal tenderness, worse in upper abdomen). There is no rebound and no guarding.  Musculoskeletal: Normal range of motion. He exhibits no edema or tenderness.  Lymphadenopathy:    He has no cervical adenopathy.  Neurological: He is alert and oriented to person, place, and time. He displays normal reflexes. He exhibits normal muscle tone. Coordination normal.  Skin: Skin is warm and dry. No rash noted. No erythema. No pallor.  Psychiatric: He has a normal mood and affect. His behavior is normal. Judgment and thought content normal.  Nursing note and vitals reviewed.   ED Course  Procedures (including critical care time)  DIAGNOSTIC STUDIES: Oxygen Saturation is 98% on RA, Normal by my interpretation.    COORDINATION OF CARE: 12:23 AM- Will give Bentyl and GI cocktail. Will order urine microscopic, CBC, CMP, Lipase, and urinalysis. Discussed treatment plan with pt at bedside and pt agreed to plan.     Labs Review Labs Reviewed  COMPREHENSIVE METABOLIC PANEL - Abnormal; Notable for the following:    Potassium 3.4 (*)    Chloride 100 (*)    Glucose, Bld 119 (*)    Total Protein 8.6 (*)    AST 49 (*)    All other components within normal limits  URINALYSIS, ROUTINE W REFLEX MICROSCOPIC (NOT AT [facility]) - Abnormal; Notable for the following:    Ketones, ur 40 (*)    Leukocytes, UA TRACE (*)    All other components within normal limits  CBC WITH DIFFERENTIAL/PLATELET  LIPASE, BLOOD  URINE MICROSCOPIC-ADD ON    Imaging Review Ct Abdomen Pelvis W Contrast  08/22/2014    CLINICAL DATA:  Epigastric abdominal pain for 1 week.  EXAM: CT ABDOMEN AND PELVIS WITH CONTRAST  TECHNIQUE: Multidetector CT imaging of the abdomen and pelvis was performed using the standard protocol following bolus administration of intravenous contrast.  CONTRAST:  10mL OMNIPAQUE IOHEXOL 300 MG/ML SOLN, 164mL OMNIPAQUE IOHEXOL 300 MG/ML SOLN  COMPARISON:  Radiograph sound ultrasound 08/09/2014  FINDINGS: The included lung bases are clear.  The liver, gallbladder, spleen, pancreas, and adrenal glands are normal.  The kidneys demonstrate symmetric enhancement without hydronephrosis. There is a lobulated 1.4 x 1.1 cm stone versus clustered stones in the interpolar right kidney without hydronephrosis. No additional renal stones.  The stomach is physiologically distended. Small hiatal hernia. There are no dilated or thickened bowel loops. The appendix is not identified. Moderate stool in the right colon, small volume of stool in the distal colon. No colonic wall thickening. No significant diverticular disease. No free air, free  fluid, or intra-abdominal fluid collection.  No retroperitoneal adenopathy. Abdominal aorta is normal in caliber. There is minimal atherosclerosis.  Within the pelvis the bladder is physiologically distended. Prostate gland is normal in size. No pelvic free fluid or adenopathy.  Degenerative disc disease at L5-S1. There are no acute or suspicious osseous abnormalities.  IMPRESSION: 1. No acute abnormality in the abdomen/pelvis. 2. Lobulated 1.4 x 1.1 cm right renal stone versus cluster of small stones. No obstructive uropathy. 3. Small hiatal hernia.   Electronically Signed   By: Jeb Levering M.D.   On: 08/22/2014 23:27     EKG Interpretation None      MDM   Final diagnoses:  Upper abdominal pain  Bloating  Hiatal hernia  Gastroesophageal reflux disease, esophagitis presence not specified    49 year old male recently seen for epigastric pain, hiatal hernia.  Has been on  Protonix for just a few days, Carafate.  Patient not taking tramadol.  Constipation, but not taking anything for that either.  Patient had expectations of being seen by GI.  He came to the main campus.  I have discussed the patient has an appointment in 6 days.  He has been encouraged to take medications as prescribed.  We'll give GI cocktail and Bentyl here.  Give prescriptions for your lax, Bentyl.  CT scan reviewed, small hiatal hernia, large right renal stone, no significant obstipation  I personally performed the services described in this documentation, which was scribed in my presence. The recorded information has been reviewed and is accurate.    Linton Flemings, MD 08/24/14 [phone]

## 2014-08-23 NOTE — ED Notes (Signed)
Pt expressed understanding of discharge instructions,

## 2014-08-23 NOTE — ED Notes (Signed)
Pt requesting a different prescription for pain, Dr Tomi Bamberger notified, no additional prescriptions given,

## 2014-08-24 MED ORDER — DICYCLOMINE HCL 10 MG/ML IM SOLN
20.0000 mg | Freq: Once | INTRAMUSCULAR | Status: AC
  Administered 2014-08-24: 20 mg via INTRAMUSCULAR
  Filled 2014-08-24: qty 2

## 2014-08-24 MED ORDER — GI COCKTAIL ~~LOC~~
30.0000 mL | Freq: Once | ORAL | Status: AC
  Administered 2014-08-24: 30 mL via ORAL
  Filled 2014-08-24: qty 30

## 2014-08-24 MED ORDER — POLYETHYLENE GLYCOL 3350 17 G PO PACK: 17.0000 g | PACK | Freq: Every day | ORAL | Status: DC

## 2014-08-24 MED ORDER — DICYCLOMINE HCL 20 MG PO TABS: 20.0000 mg | ORAL_TABLET | Freq: Four times a day (QID) | ORAL | Status: DC | PRN

## 2014-08-24 MED ORDER — MAGNESIUM CITRATE PO SOLN
1.0000 | Freq: Once | ORAL | Status: AC
  Administered 2014-08-24: 1 via ORAL
  Filled 2014-08-24: qty 296

## 2014-08-24 NOTE — Discharge Instructions (Signed)
Abdominal Pain Many things can cause belly (abdominal) pain. Most times, the belly pain is not dangerous. Many cases of belly pain can be watched and treated at home. HOME CARE   Do not take medicines that help you go poop (laxatives) unless told to by your doctor.  Only take medicine as told by your doctor.  Eat or drink as told by your doctor. Your doctor will tell you if you should be on a special diet. GET HELP IF:  You do not know what is causing your belly pain.  You have belly pain while you are sick to your stomach (nauseous) or have runny poop (diarrhea).  You have pain while you pee or poop.  Your belly pain wakes you up at night.  You have belly pain that gets worse or better when you eat.  You have belly pain that gets worse when you eat fatty foods.  You have a fever. GET HELP RIGHT AWAY IF:   The pain does not go away within 2 hours.  You keep throwing up (vomiting).  The pain changes and is only in the right or left part of the belly.  You have bloody or tarry looking poop. MAKE SURE YOU:   Understand these instructions.  Will watch your condition.  Will get help right away if you are not doing well or get worse. Document Released: 08/26/2007 Document Revised: 03/14/2013 Document Reviewed: 11/16/2012 [facility] Patient Information 2015 [place], [place]. This information is not intended to replace advice given to you by your health care provider. Make sure you discuss any questions you have with your health care provider.  Bloating Bloating is the feeling of fullness in your belly. You may feel as though your pants are too tight. Often the cause of bloating is overeating, retaining fluids, or having gas in your bowel. It is also caused by swallowing air and eating foods that cause gas. Irritable bowel syndrome is one of the most common causes of bloating. Constipation is also a common cause. Sometimes more serious problems can cause bloating. SYMPTOMS  Usually  there is a feeling of fullness, as though your abdomen is bulged out. There may be mild discomfort.  DIAGNOSIS  Usually no particular testing is necessary for most bloating. If the condition persists and seems to become worse, your caregiver may do additional testing.  TREATMENT   There is no direct treatment for bloating.  Do not put gas into the bowel. Avoid chewing gum and sucking on candy. These tend to make you swallow air. Swallowing air can also be a nervous habit. Try to avoid this.  Avoiding high residue diets will help. Eat foods with soluble fibers (examples include root vegetables, apples, or barley) and substitute dairy products with soy and rice products. This helps irritable bowel syndrome.  If constipation is the cause, then a high residue diet with more fiber will help.  Avoid carbonated beverages.  Over-the-counter preparations are available that help reduce gas. Your pharmacist can help you with this. SEEK MEDICAL CARE IF:   Bloating continues and seems to be getting worse.  You notice a weight gain.  You have a weight loss but the bloating is getting worse.  You have changes in your bowel habits or develop nausea or vomiting. SEEK IMMEDIATE MEDICAL CARE IF:   You develop shortness of breath or swelling in your legs.  You have an increase in abdominal pain or develop chest pain. Document Released: 01/07/2006 Document Revised: 06/01/2011 Document Reviewed: 02/25/2007 [employer]  Patient Information 2015 [place]. This information is not intended to replace advice given to you by your health care provider. Make sure you discuss any questions you have with your health care provider.  Food Choices for Gastroesophageal Reflux Disease When you have gastroesophageal reflux disease (GERD), the foods you eat and your eating habits are very important. Choosing the right foods can help ease the discomfort of GERD. WHAT GENERAL GUIDELINES DO I NEED TO FOLLOW?  Choose  fruits, vegetables, whole grains, low-fat dairy products, and low-fat meat, fish, and poultry.  Limit fats such as oils, salad dressings, butter, nuts, and avocado.  Keep a food diary to identify foods that cause symptoms.  Avoid foods that cause reflux. These may be different for different people.  Eat frequent small meals instead of three large meals each day.  Eat your meals slowly, in a relaxed setting.  Limit fried foods.  Cook foods using methods other than frying.  Avoid drinking alcohol.  Avoid drinking large amounts of liquids with your meals.  Avoid bending over or lying down until 2-3 hours after eating. WHAT FOODS ARE NOT RECOMMENDED? The following are some foods and drinks that may worsen your symptoms: Vegetables Tomatoes. Tomato juice. Tomato and spaghetti sauce. Chili peppers. Onion and garlic. Horseradish. Fruits Oranges, grapefruit, and lemon (fruit and juice). Meats High-fat meats, fish, and poultry. This includes hot dogs, ribs, ham, sausage, salami, and bacon. Dairy Whole milk and chocolate milk. Sour cream. Cream. Butter. Ice cream. Cream cheese.  Beverages Coffee and tea, with or without caffeine. Carbonated beverages or energy drinks. Condiments Hot sauce. Barbecue sauce.  Sweets/Desserts Chocolate and cocoa. Donuts. Peppermint and spearmint. Fats and Oils High-fat foods, including [place] fries and potato chips. Other Vinegar. Strong spices, such as black pepper, white pepper, red pepper, cayenne, curry powder, cloves, ginger, and chili powder. The items listed above may not be a complete list of foods and beverages to avoid. Contact your dietitian for more information. Document Released: 03/09/2005 Document Revised: 03/14/2013 Document Reviewed: 01/11/2013 [facility] Patient Information 2015 [place], [place]. This information is not intended to replace advice given to you by your health care provider. Make sure you discuss any questions you have with  your health care provider.  Gastroesophageal Reflux Disease, Adult Gastroesophageal reflux disease (GERD) happens when acid from your stomach flows up into the esophagus. When acid comes in contact with the esophagus, the acid causes soreness (inflammation) in the esophagus. Over time, GERD may create small holes (ulcers) in the lining of the esophagus. CAUSES   Increased body weight. This puts pressure on the stomach, making acid rise from the stomach into the esophagus.  Smoking. This increases acid production in the stomach.  Drinking alcohol. This causes decreased pressure in the lower esophageal sphincter (valve or ring of muscle between the esophagus and stomach), allowing acid from the stomach into the esophagus.  Late evening meals and a full stomach. This increases pressure and acid production in the stomach.  A malformed lower esophageal sphincter. Sometimes, no cause is found. SYMPTOMS   Burning pain in the lower part of the mid-chest behind the breastbone and in the mid-stomach area. This may occur twice a week or more often.  Trouble swallowing.  Sore throat.  Dry cough.  Asthma-like symptoms including chest tightness, shortness of breath, or wheezing. DIAGNOSIS  Your caregiver may be able to diagnose GERD based on your symptoms. In some cases, X-rays and other tests may be done to check for complications or  to check the condition of your stomach and esophagus. TREATMENT  Your caregiver may recommend over-the-counter or prescription medicines to help decrease acid production. Ask your caregiver before starting or adding any new medicines.  HOME CARE INSTRUCTIONS   Change the factors that you can control. Ask your caregiver for guidance concerning weight loss, quitting smoking, and alcohol consumption.  Avoid foods and drinks that make your symptoms worse, such as:  Caffeine or alcoholic drinks.  Chocolate.  Peppermint or mint flavorings.  Garlic and  onions.  Spicy foods.  Citrus fruits, such as oranges, lemons, or limes.  Tomato-based foods such as sauce, chili, salsa, and pizza.  Fried and fatty foods.  Avoid lying down for the 3 hours prior to your bedtime or prior to taking a nap.  Eat small, frequent meals instead of large meals.  Wear loose-fitting clothing. Do not wear anything tight around your waist that causes pressure on your stomach.  Raise the head of your bed 6 to 8 inches with wood blocks to help you sleep. Extra pillows will not help.  Only take over-the-counter or prescription medicines for pain, discomfort, or fever as directed by your caregiver.  Do not take aspirin, ibuprofen, or other nonsteroidal anti-inflammatory drugs (NSAIDs). SEEK IMMEDIATE MEDICAL CARE IF:   You have pain in your arms, neck, jaw, teeth, or back.  Your pain increases or changes in intensity or duration.  You develop nausea, vomiting, or sweating (diaphoresis).  You develop shortness of breath, or you faint.  Your vomit is green, yellow, black, or looks like coffee grounds or blood.  Your stool is red, bloody, or black. These symptoms could be signs of other problems, such as heart disease, gastric bleeding, or esophageal bleeding. MAKE SURE YOU:   Understand these instructions.  Will watch your condition.  Will get help right away if you are not doing well or get worse. Document Released: 12/17/2004 Document Revised: 06/01/2011 Document Reviewed: 09/26/2010 [facility] Patient Information 2015 [place], [place]. This information is not intended to replace advice given to you by your health care provider. Make sure you discuss any questions you have with your health care provider.  Hiatal Hernia A hiatal hernia occurs when part of your stomach slides above the muscle that separates your abdomen from your chest (diaphragm). You can be born with a hiatal hernia (congenital), or it may develop over time. In almost all cases of hiatal  hernia, only the top part of the stomach pushes through.  Many people have a hiatal hernia with no symptoms. The larger the hernia, the more likely that you will have symptoms. In some cases, a hiatal hernia allows stomach acid to flow back into the tube that carries food from your mouth to your stomach (esophagus). This may cause heartburn symptoms. Severe heartburn symptoms may mean you have developed a condition called gastroesophageal reflux disease (GERD).  CAUSES  Hiatal hernias are caused by a weakness in the opening (hiatus) where your esophagus passes through your diaphragm to attach to the upper part of your stomach. You may be born with a weakness in your hiatus, or a weakness can develop. RISK FACTORS Older age is a major risk factor for a hiatal hernia. Anything that increases pressure on your diaphragm can also increase your risk of a hiatal hernia. This includes:  Pregnancy.  Excess weight.  Frequent constipation. SIGNS AND SYMPTOMS  People with a hiatal hernia often have no symptoms. If symptoms develop, they are almost always caused by GERD. They  may include:  Heartburn.  Belching.  Indigestion.  Trouble swallowing.  Coughing or wheezing.  Sore throat.  Hoarseness.  Chest pain. DIAGNOSIS  A hiatal hernia is sometimes found during an exam for another problem. Your health care provider may suspect a hiatal hernia if you have symptoms of GERD. Tests may be done to diagnose GERD. These may include:  X-rays of your stomach or chest.  An upper gastrointestinal (GI) series. This is an X-ray exam of your GI tract involving the use of a chalky liquid that you swallow. The liquid shows up clearly on the X-ray.  Endoscopy. This is a procedure to look into your stomach using a thin, flexible tube that has a tiny camera and light on the end of it. TREATMENT  If you have no symptoms, you may not need treatment. If you have symptoms, treatment may include:  Dietary and  lifestyle changes to help reduce GERD symptoms.  Medicines. These may include:  Over-the-counter antacids.  Medicines that make your stomach empty more quickly.  Medicines that block the production of stomach acid (H2 blockers).  Stronger medicines to reduce stomach acid (proton pump inhibitors).  You may need surgery to repair the hernia if other treatments are not helping. HOME CARE INSTRUCTIONS   Take all medicines as directed by your health care provider.  Quit smoking, if you smoke.  Try to achieve and maintain a healthy body weight.  Eat frequent small meals instead of three large meals a day. This keeps your stomach from getting too full.  Eat slowly.  Do not lie down right after eating.  Do noteat 1-2 hours before bed.   Do not drink beverages with caffeine. These include cola, coffee, cocoa, and tea.  Do not drink alcohol.  Avoid foods that can make symptoms of GERD worse. These may include:  Fatty foods.  Citrus fruits.  Other foods and drinks that contain acid.  Avoid putting pressure on your belly. Anything that puts pressure on your belly increases the amount of acid that may be pushed up into your esophagus.   Avoid bending over, especially after eating.  Raise the head of your bed by putting blocks under the legs. This keeps your head and esophagus higher than your stomach.  Do not wear tight clothing around your chest or stomach.  Try not to strain when having a bowel movement, when urinating, or when lifting heavy objects. SEEK MEDICAL CARE IF:  Your symptoms are not controlled with medicines or lifestyle changes.  You are having trouble swallowing.  You have coughing or wheezing that will not go away. SEEK IMMEDIATE MEDICAL CARE IF:  Your pain is getting worse.  Your pain spreads to your arms, neck, jaw, teeth, or back.  You have shortness of breath.  You sweat for no reason.  You feel sick to your stomach (nauseous) or  vomit.  You vomit blood.  You have bright red blood in your stools.  You have black, tarry stools.  Document Released: 05/30/2003 Document Revised: 07/24/2013 Document Reviewed: 02/24/2013 [facility] Patient Information 2015 [place], [place]. This information is not intended to replace advice given to you by your health care provider. Make sure you discuss any questions you have with your health care provider.

## 2014-08-30 ENCOUNTER — Ambulatory Visit (INDEPENDENT_AMBULATORY_CARE_PROVIDER_SITE_OTHER): Payer: BLUE CROSS/BLUE SHIELD | Admitting: Nurse Practitioner

## 2014-08-30 ENCOUNTER — Encounter: Payer: Self-pay | Admitting: Nurse Practitioner

## 2014-08-30 VITALS — BP 159/106 | HR 115 | Temp 97.7°F | Ht 69.0 in | Wt 150.8 lb

## 2014-08-30 DIAGNOSIS — K219 Gastro-esophageal reflux disease without esophagitis: Secondary | ICD-10-CM

## 2014-08-30 DIAGNOSIS — R109 Unspecified abdominal pain: Secondary | ICD-10-CM

## 2014-08-30 DIAGNOSIS — K5900 Constipation, unspecified: Secondary | ICD-10-CM | POA: Diagnosis not present

## 2014-08-30 NOTE — Patient Instructions (Signed)
1. Continue taking her Protonix every day. If he ran out call us and we can send in a refill. 2. Continue to take your Colace stool softener daily. 3. He may take the Carafate 4 times a day, take it only if needed for worsening symptoms. 4. Return for further evaluation in 2 months.

## 2014-08-30 NOTE — Assessment & Plan Note (Signed)
Patient with constipation showing small to moderate amount of stool burden on CT imaging in the emergency room. Has started taking daily Colace which is improved his symptoms is now going regularly of soft formed stools. Continue taking Colace daily, can add MiraLAX 3 times a week to daily as needed. Return for follow-up in 2 months.

## 2014-08-30 NOTE — Assessment & Plan Note (Signed)
GERD symptoms have begun improving with the addition of Protonix 40 mg daily. Has also been taking Carafate 4 times a day per ER instructions. He states he is not having abdominal pain each time he takes Carafate. Continue Protonix daily, decrease Carafate to 4 times a day as needed for breakthrough symptoms. We'll have him return for further evaluation in 2 months. If his symptoms are resolved can continue the Protonix. If continued symptoms on Protonix consider possible upper endoscopic evaluation.

## 2014-08-30 NOTE — Progress Notes (Signed)
Primary Care Physician:  Collene Mares, PA-C Primary Gastroenterologist:  Dr. Gala Romney  Chief Complaint  Patient presents with  . Follow-up  . Abdominal Pain    er follow up    HPI:   49 year old male presents for emergency room follow-up visit. ER notes and imaging reviewed. Patient presented 3 times in 2 days for upper abdominal pain. On his last visit he was given Carafate and Protonix. His imaging including CT of the abdomen and pelvis showed small hiatal hernia and moderate stool. No previous colonoscopy or endoscopy notes could be found in the system.  Today he states his abdominal pain is improving, but appetite hasn't returned yet. Constipation is improved since starting daily colace stool softener. Regular bowel movements are soft and formed. He is taking Protonix daily since in the ER. Is still using Carafate qid scheduled as ordered even if no pain. Has also changed his diet. Denies chest pain, dyspnea, dizziness, lightheadedness, syncope, near syncope. Denies any other upper or lower GI symptoms.   Past Medical History  Diagnosis Date  . Hypertension     Past Surgical History  Procedure Laterality Date  . Appendectomy      Current Outpatient Prescriptions  Medication Sig Dispense Refill  . dicyclomine (BENTYL) 20 MG tablet Take 1 tablet (20 mg total) by mouth every 6 (six) hours as needed for spasms (for abdominal cramping). 20 tablet 0  . docusate sodium (COLACE) 100 MG capsule Take 100 mg by mouth daily as needed for mild constipation.    Marland Kitchen ibuprofen (ADVIL,MOTRIN) 200 MG tablet Take 200 mg by mouth every 6 (six) hours as needed for mild pain or moderate pain.    Marland Kitchen lisinopril (PRINIVIL,ZESTRIL) 10 MG tablet Take 10 mg by mouth daily.  5  . ondansetron (ZOFRAN ODT) 4 MG disintegrating tablet Take 1 tablet (4 mg total) by mouth every 8 (eight) hours as needed for nausea or vomiting. 20 tablet 0  . pantoprazole (PROTONIX) 40 MG tablet Take 1 tablet (40 mg total) by  mouth daily. 30 tablet 1  . ranitidine (ZANTAC) 150 MG tablet TK 1 T PO BID PRN  11  . sucralfate (CARAFATE) 1 G tablet Take 1 tablet (1 g total) by mouth 4 (four) times daily -  with meals and at bedtime. 30 tablet 0  . zolpidem (AMBIEN) 10 MG tablet TK 1 T PO QHS PRN  1  . diphenhydramine-acetaminophen (TYLENOL PM) 25-500 MG TABS Take 1 tablet by mouth daily.    . polyethylene glycol (MIRALAX / GLYCOLAX) packet Take 17 g by mouth daily. Start by taking 1 capful in 8 ounces of liquid of your choice 3 times a day until you are having loose stools then back down to 1 capful a day (Patient not taking: Reported on 08/30/2014) 14 each 0  . traMADol (ULTRAM) 50 MG tablet Take 1 tablet (50 mg total) by mouth every 6 (six) hours as needed. (Patient not taking: Reported on 08/24/2014) 20 tablet 0   No current facility-administered medications for this visit.    Allergies as of 08/30/2014 - Review Complete 08/30/2014  Allergen Reaction Noted  . Hydrocodone Nausea And Vomiting 08/09/2014  . Tramadol Nausea And Vomiting 08/24/2014    Family History  Problem Relation Age of Onset  . Hypertension Mother   . Hypertension Father     History   Social History  . Marital Status: Married    Spouse Name: N/A  . Number of Children: N/A  . Years  of Education: N/A   Occupational History  . Not on file.   Social History Main Topics  . Smoking status: Former Research scientist (life sciences)  . Smokeless tobacco: Not on file  . Alcohol Use: No  . Drug Use: No  . Sexual Activity: Yes   Other Topics Concern  . Not on file   Social History Narrative    Review of Systems: General: Negative for anorexia, weight loss, fever, chills, fatigue, weakness. ENT: Negative for hoarseness, difficulty swallowing. CV: Negative for chest pain, angina, palpitations, peripheral edema.  Respiratory: Negative for dyspnea at rest, cough, wheezing.  GI: See history of present illness. MS: Negative for joint pain, low back pain.  Derm:  Negative for rash or itching.  Endo: Negative for unusual weight change.  Heme: Negative for bruising or bleeding. Allergy: Negative for rash or hives.    Physical Exam: BP 159/106 mmHg  Pulse 115  Temp(Src) 97.7 F (36.5 C)  Ht 5\' 9"  (1.753 m)  Wt 150 lb 12.8 oz (68.402 kg)  BMI 22.26 kg/m2 General:   Alert and oriented. Pleasant and cooperative. Well-nourished and well-developed.  Head:  Normocephalic and atraumatic. Eyes:  Without icterus, sclera clear and conjunctiva pink.  Ears:  Normal auditory acuity. Cardiovascular:  S1, S2 present without murmurs appreciated. Normal pulses noted. Extremities without clubbing or edema. Respiratory:  Clear to auscultation bilaterally. No wheezes, rales, or rhonchi. No distress.  Gastrointestinal:  +BS, soft, and non-distended. Mild epigastric TTP. No HSM noted. No guarding or rebound. No masses appreciated.  Rectal:  Deferred  Musculoskalatal:  Symmetrical without gross deformities. Normal posture. Skin:  Intact without significant lesions or rashes. Neurologic:  Alert and oriented x4;  grossly normal neurologically. Psych:  Alert and cooperative. Normal mood and affect. Heme/Lymph/Immune: No excessive bruising noted.    08/30/2014 9:46 AM

## 2014-08-30 NOTE — Assessment & Plan Note (Addendum)
Abdominal pain likely multifactorial between moderate to severe GERD symptoms as well as constipation. With the addition of Protonix, Carafate, and daily stool softeners his abdominal pain has subsequently improved greatly. We will have him return for further evaluation in 2 months for any changes needed.  Also, the patient is likely due for screening colonoscopy. We can discuss this at his next visit

## 2014-08-31 NOTE — Progress Notes (Signed)
CC'ED TO PCP 

## 2014-10-08 ENCOUNTER — Telehealth: Payer: Self-pay | Admitting: Internal Medicine

## 2014-10-08 MED ORDER — PANTOPRAZOLE SODIUM 40 MG PO TBEC: 40.0000 mg | DELAYED_RELEASE_TABLET | Freq: Every day | ORAL | Status: DC

## 2014-10-08 NOTE — Telephone Encounter (Signed)
Routing to the refill box. 

## 2014-10-08 NOTE — Telephone Encounter (Signed)
Patient called to say that he has OV with [place] on 8/9 to see EG and will run out of his medicine before then and would like a refill called into [facility] in [place]. Pantoprazole 40mg 

## 2014-10-08 NOTE — Telephone Encounter (Signed)
Completed.

## 2014-10-21 ENCOUNTER — Encounter (HOSPITAL_COMMUNITY): Payer: Self-pay | Admitting: Emergency Medicine

## 2014-10-21 ENCOUNTER — Emergency Department (HOSPITAL_COMMUNITY)
Admission: EM | Admit: 2014-10-21 | Discharge: 2014-10-21 | Disposition: A | Discharge status: Home / Self Care | Payer: BLUE CROSS/BLUE SHIELD | Attending: Emergency Medicine | Admitting: Emergency Medicine

## 2014-10-21 DIAGNOSIS — G44209 Tension-type headache, unspecified, not intractable: Secondary | ICD-10-CM | POA: Diagnosis not present

## 2014-10-21 DIAGNOSIS — K4090 Unilateral inguinal hernia, without obstruction or gangrene, not specified as recurrent: Secondary | ICD-10-CM | POA: Diagnosis present

## 2014-10-21 DIAGNOSIS — Z87891 Personal history of nicotine dependence: Secondary | ICD-10-CM | POA: Diagnosis not present

## 2014-10-21 DIAGNOSIS — Z79899 Other long term (current) drug therapy: Secondary | ICD-10-CM | POA: Diagnosis not present

## 2014-10-21 DIAGNOSIS — K219 Gastro-esophageal reflux disease without esophagitis: Secondary | ICD-10-CM | POA: Diagnosis not present

## 2014-10-21 DIAGNOSIS — I10 Essential (primary) hypertension: Secondary | ICD-10-CM

## 2014-10-21 HISTORY — DX: Gastro-esophageal reflux disease without esophagitis: K21.9

## 2014-10-21 HISTORY — DX: Diaphragmatic hernia without obstruction or gangrene: K44.9

## 2014-10-21 MED ORDER — GI COCKTAIL ~~LOC~~
30.0000 mL | Freq: Once | ORAL | Status: AC
  Administered 2014-10-21: 30 mL via ORAL
  Filled 2014-10-21: qty 30

## 2014-10-21 MED ORDER — ACETAMINOPHEN 500 MG PO TABS
1000.0000 mg | ORAL_TABLET | Freq: Once | ORAL | Status: AC
  Administered 2014-10-21: 1000 mg via ORAL
  Filled 2014-10-21: qty 2

## 2014-10-21 NOTE — ED Notes (Signed)
Pt reports increased gastric reflux and and pain related to a known hiatal hernia, he has an appt was a GI MD this week. Pt also reports HTN states b/p 176/114 on his machine at home. Pt reports throbbing feeling in his head, Denies any blurred vision or headache.

## 2014-10-21 NOTE — Discharge Instructions (Signed)

## 2014-10-21 NOTE — ED Provider Notes (Signed)
CSN: [id number]     Arrival date & time 10/21/14  1714 History   First MD Initiated Contact with Patient 10/21/14 1727     Chief Complaint  Patient presents with  . Hypertension  . Hernia     (Consider location/radiation/quality/duration/timing/severity/associated sxs/prior Treatment) Patient is a 49 y.o. male presenting with hypertension and headaches. The history is provided by the patient.  Hypertension This is a chronic problem. The current episode started 12 to 24 hours ago. The problem occurs constantly. The problem has not changed since onset.Associated symptoms include abdominal pain (upper, with reflux symptoms) and headaches (noticed today). Nothing aggravates the symptoms. Nothing relieves the symptoms. He has tried nothing for the symptoms. The treatment provided no relief.  Headache Pain location:  Generalized Quality:  Dull Radiates to:  Does not radiate Onset quality:  Gradual Duration:  1 day Timing:  Constant Progression:  Unchanged Chronicity:  New Context: activity   Relieved by:  Nothing Worsened by:  Nothing Ineffective treatments:  None tried Associated symptoms: abdominal pain (upper, with reflux symptoms)   Associated symptoms: no blurred vision, no dizziness, no fever, no loss of balance, no neck pain, no numbness and no visual change     Past Medical History  Diagnosis Date  . Hypertension   . Hiatal hernia   . GERD (gastroesophageal reflux disease)   . Peptic ulcer    Past Surgical History  Procedure Laterality Date  . Appendectomy     Family History  Problem Relation Age of Onset  . Hypertension Mother   . Hypertension Father   . Colon cancer Neg Hx    History  Substance Use Topics  . Smoking status: Former Research scientist (life sciences)  . Smokeless tobacco: Never Used  . Alcohol Use: No    Review of Systems  Constitutional: Negative for fever.  Eyes: Negative for blurred vision.  Gastrointestinal: Positive for abdominal pain (upper, with reflux  symptoms).  Musculoskeletal: Negative for neck pain.  Neurological: Positive for headaches (noticed today). Negative for dizziness, numbness and loss of balance.  All other systems reviewed and are negative.     Allergies  Hydrocodone  Home Medications   Prior to Admission medications   Medication Sig Start Date End Date Taking? Authorizing Provider  alum & mag hydroxide-simeth (MAALOX/MYLANTA) 200-200-20 MG/5ML suspension Take 15 mLs by mouth every 6 (six) hours as needed for indigestion or heartburn.   Yes Historical Provider, MD  bismuth subsalicylate (PEPTO BISMOL) 262 MG/15ML suspension Take 30 mLs by mouth every 6 (six) hours as needed for indigestion.   Yes Historical Provider, MD  docusate sodium (COLACE) 100 MG capsule Take 100 mg by mouth daily as needed for mild constipation.   Yes Historical Provider, MD  hydroxypropyl methylcellulose / hypromellose (ISOPTO TEARS / GONIOVISC) 2.5 % ophthalmic solution Place 1 drop into both eyes as needed for dry eyes.   Yes Historical Provider, MD  lisinopril (PRINIVIL,ZESTRIL) 10 MG tablet Take 10 mg by mouth daily. 08/07/14  Yes Historical Provider, MD  ondansetron (ZOFRAN ODT) 4 MG disintegrating tablet Take 1 tablet (4 mg total) by mouth every 8 (eight) hours as needed for nausea or vomiting. 08/22/14  Yes Kristen N Ward, DO  pantoprazole (PROTONIX) 40 MG tablet Take 1 tablet (40 mg total) by mouth daily. 10/08/14  Yes Orvil Feil, NP  polyethylene glycol (MIRALAX / GLYCOLAX) packet Take 17 g by mouth daily as needed for mild constipation.   Yes Historical Provider, MD  Potassium (POTASSIMIN PO) Take 1  tablet by mouth daily.   Yes Historical Provider, MD  sucralfate (CARAFATE) 1 G tablet Take 1 tablet (1 g total) by mouth 4 (four) times daily -  with meals and at bedtime. 08/09/14  Yes Noemi Chapel, MD  traMADol (ULTRAM) 50 MG tablet Take 50 mg by mouth every 6 (six) hours as needed.   Yes Historical Provider, MD  zolpidem (AMBIEN) 10 MG tablet  Take 1 tablet at bedtime as needed. 08/22/14  Yes Historical Provider, MD  dicyclomine (BENTYL) 20 MG tablet Take 1 tablet (20 mg total) by mouth every 6 (six) hours as needed for spasms (for abdominal cramping). 08/24/14   Linton Flemings, MD   BP 156/100 mmHg  Pulse 111  Temp(Src) 97.5 F (36.4 C) (Oral)  Resp 16  Ht 5\' 9"  (1.753 m)  Wt 151 lb (68.493 kg)  BMI 22.29 kg/m2  SpO2 98% Physical Exam  Constitutional: He is oriented to person, place, and time. He appears well-developed and well-nourished. No distress.  HENT:  Head: Normocephalic and atraumatic.  Eyes: Conjunctivae are normal.  Neck: Neck supple. No tracheal deviation present.  Cardiovascular: Normal rate and regular rhythm.   Pulmonary/Chest: Effort normal. No respiratory distress.  Abdominal: Soft. He exhibits no distension. There is no tenderness.  Neurological: He is alert and oriented to person, place, and time. He has normal strength. No sensory deficit. Coordination and gait normal. GCS eye subscore is 4. GCS verbal subscore is 5. GCS motor subscore is 6.  Skin: Skin is warm and dry.  Psychiatric: He has a normal mood and affect. His speech is normal and behavior is normal. Cognition and memory are normal.    ED Course  Procedures (including critical care time) Labs Review Labs Reviewed - No data to display  Imaging Review No results found.   EKG Interpretation None      MDM   Final diagnoses:  Chronic hypertension  Gastroesophageal reflux disease, esophagitis presence not specified  Tension-type headache, not intractable, unspecified chronicity pattern    49 year old male presents with discomfort related to ongoing gastroesophageal reflux for which she is following with a gastroenterologist. He has secondary complaint of development of a headache after which he noticed his blood pressure was high. He has no neurologic deficits, no difficulty with balance, gait, or ambulation. At this time I feel that  neuroimaging is not indicated, was given a dose of Tylenol for his headache and monitored in the emergency department for any acute changes. Discharged with plan for PCP follow-up and to keep his appointment with his gastroenterologist.    Leo Grosser, MD 10/21/14 1919

## 2014-10-22 ENCOUNTER — Telehealth: Payer: Self-pay

## 2014-10-22 MED ORDER — PROMETHAZINE HCL 25 MG PO TABS: 12.5000 mg | ORAL_TABLET | Freq: Four times a day (QID) | ORAL | Status: DC | PRN

## 2014-10-22 NOTE — Telephone Encounter (Signed)
pts wife called- pt is still having a lot of pain, nausea and spitting up. She said he is not vomiting but just spitting up at times. He has an rx for nausea medication and said it wasn't helping. No fever. bm's are now normal, no blood in stool. He is loosing weight. Pt having problems with HTN and headaches as well. Was seen in the ED yesterday.  Pt has ov with EG next week and wants to know if there is anything we can prescribe him to help until his ov.   Pt is taking protonix daily and carafate 3-4 times a day. She asked about a refill on his tramadol and I informed her that we do not refill pain medications and she said she would call his pcp about it.

## 2014-10-22 NOTE — Telephone Encounter (Signed)
Pt is aware. #2 boxes of dexilant with instructions are at the front desk for him to pick up. Advised him that phenergan was at the pharmacy. Also advised him that it could make him sleepy when he takes it. Pt verbalized understanding.

## 2014-10-22 NOTE — Telephone Encounter (Signed)
Let's trial Dexilant. May give samples. I sent in Phenergan to take. Take 1/2 tablet every 6 hours as needed for nausea.   He definitely needs an EGD set up at next appt.

## 2014-10-30 ENCOUNTER — Other Ambulatory Visit: Payer: Self-pay

## 2014-10-30 ENCOUNTER — Ambulatory Visit (INDEPENDENT_AMBULATORY_CARE_PROVIDER_SITE_OTHER): Payer: BLUE CROSS/BLUE SHIELD | Admitting: Nurse Practitioner

## 2014-10-30 ENCOUNTER — Encounter: Payer: Self-pay | Admitting: Nurse Practitioner

## 2014-10-30 VITALS — BP 147/96 | HR 102 | Temp 96.7°F | Ht 69.0 in | Wt 144.0 lb

## 2014-10-30 DIAGNOSIS — Z1211 Encounter for screening for malignant neoplasm of colon: Secondary | ICD-10-CM | POA: Diagnosis not present

## 2014-10-30 DIAGNOSIS — K219 Gastro-esophageal reflux disease without esophagitis: Secondary | ICD-10-CM | POA: Diagnosis not present

## 2014-10-30 MED ORDER — DEXLANSOPRAZOLE 60 MG PO CPDR: 60.0000 mg | DELAYED_RELEASE_CAPSULE | Freq: Every day | ORAL | Status: DC

## 2014-10-30 MED ORDER — PEG 3350-KCL-NA BICARB-NACL 420 G PO SOLR: 4000.0000 mL | Freq: Once | ORAL | Status: DC

## 2014-10-30 NOTE — Progress Notes (Signed)
cc'ed to pcp °

## 2014-10-30 NOTE — Assessment & Plan Note (Signed)
49 year old average risk African-American male with no prior colonoscopy. He is currently due. Given his need for endoscopy we'll proceed with a colonoscopy at the same time. He is agreeable to this. No red flag/warning signs or symptoms.  Proceed with TCS in the OR with Dr. Gala Romney in near future: the risks, benefits, and alternatives have been discussed with the patient in detail. The patient states understanding and desires to proceed.  Patient is on narcotic pain medication, muscle relaxer, and Ambien for insomnia. Also has high levels of anxiety. For these reasons we will proceed with the procedure and the OR on propofol/MAC

## 2014-10-30 NOTE — Patient Instructions (Addendum)
1. Continue taking Dexilant. I sent the prescription and your pharmacy. I will give you a few more samples to last until he can get the medication. 2. We will schedule your procedures for you. 3. Return for follow-up in 3 months. 4. Further recommendations to be based on results your procedures.

## 2014-10-30 NOTE — Progress Notes (Signed)
Referring Provider: Ginger Organ Primary Care Physician:  Collene Mares, PA-C Primary GI:  Dr. Gala Romney  Chief Complaint  Patient presents with  . Follow-up    reflux    HPI:   49 year old male presents for follow-up on GERD, epigastric pain. At last visit he just recently been started on Protonix and was also taking Carafate 3-4 times a day. Advised to continue this regimen and allow enough time for the common effective. He called 2 months after visit stating still having symptoms and was given samples of Dexilant to try. This is about one week ago. Was also given Phenergan for nausea.  Today he states he's continuing to have symptoms. Symptoms include esophageal burning, nausea, spitting up, burping bitter material.  Denies vomiting, regurgitation, additional abdominal pain, hematochezia, melena. He has had darkened stools which were formed but he was on Pepto Bismol. Was recently seen in the ER for hypertension. Denies chest pain, dyspnea, dizziness, lightheadedness, syncope, near syncope. Denies any other upper or lower GI symptoms. He is not taking the Phenergan because he doesn't like it. He has never had a colonoscopy before.  Past Medical History  Diagnosis Date  . Hypertension   . Hiatal hernia   . GERD (gastroesophageal reflux disease)   . Peptic ulcer     Past Surgical History  Procedure Laterality Date  . Appendectomy      Current Outpatient Prescriptions  Medication Sig Dispense Refill  . acetaminophen (TYLENOL) 500 MG tablet Take 500 mg by mouth every 6 (six) hours as needed.    . bismuth subsalicylate (PEPTO BISMOL) 262 MG/15ML suspension Take 30 mLs by mouth every 6 (six) hours as needed for indigestion.    . cyclobenzaprine (FLEXERIL) 10 MG tablet Take 10 mg by mouth 3 (three) times daily as needed for muscle spasms. Only as needed    . dexlansoprazole (DEXILANT) 60 MG capsule Take 60 mg by mouth daily.    Marland Kitchen dicyclomine (BENTYL) 20 MG tablet Take 1  tablet (20 mg total) by mouth every 6 (six) hours as needed for spasms (for abdominal cramping). 20 tablet 0  . docusate sodium (COLACE) 100 MG capsule Take 100 mg by mouth daily as needed for mild constipation.    Marland Kitchen lisinopril (PRINIVIL,ZESTRIL) 10 MG tablet Take 10 mg by mouth daily.  5  . Multiple Vitamin (MULTIVITAMIN) capsule Take 1 capsule by mouth daily.    . ondansetron (ZOFRAN ODT) 4 MG disintegrating tablet Take 1 tablet (4 mg total) by mouth every 8 (eight) hours as needed for nausea or vomiting. 20 tablet 0  . pantoprazole (PROTONIX) 40 MG tablet Take 1 tablet (40 mg total) by mouth daily. (Patient taking differently: Take 40 mg by mouth 2 (two) times daily. ) 30 tablet 5  . Potassium (POTASSIMIN PO) Take 1 tablet by mouth daily.    . sucralfate (CARAFATE) 1 G tablet Take 1 tablet (1 g total) by mouth 4 (four) times daily -  with meals and at bedtime. 30 tablet 0  . traMADol (ULTRAM) 50 MG tablet Take 50 mg by mouth every 6 (six) hours as needed.    . zolpidem (AMBIEN) 10 MG tablet Take 1 tablet at bedtime as needed.  1  . alum & mag hydroxide-simeth (MAALOX/MYLANTA) 200-200-20 MG/5ML suspension Take 15 mLs by mouth every 6 (six) hours as needed for indigestion or heartburn.    . hydroxypropyl methylcellulose / hypromellose (ISOPTO TEARS / GONIOVISC) 2.5 % ophthalmic solution Place 1 drop into  both eyes as needed for dry eyes.    . polyethylene glycol (MIRALAX / GLYCOLAX) packet Take 17 g by mouth daily as needed for mild constipation.    . promethazine (PHENERGAN) 25 MG tablet Take 0.5 tablets (12.5 mg total) by mouth every 6 (six) hours as needed for nausea or vomiting. (Patient not taking: Reported on 10/30/2014) 30 tablet 0   No current facility-administered medications for this visit.    Allergies as of 10/30/2014 - Review Complete 10/30/2014  Allergen Reaction Noted  . Hydrocodone Nausea And Vomiting 08/09/2014    Family History  Problem Relation Age of Onset  . Hypertension  Mother   . Hypertension Father   . Colon cancer Neg Hx     History   Social History  . Marital Status: Married    Spouse Name: N/A  . Number of Children: N/A  . Years of Education: N/A   Social History Main Topics  . Smoking status: Former Research scientist (life sciences)  . Smokeless tobacco: Never Used     Comment: Quit x 12 years  . Alcohol Use: No  . Drug Use: No  . Sexual Activity: Yes   Other Topics Concern  . None   Social History Narrative    Review of Systems: General: Negative for anorexia, weight loss, fever, chills, fatigue, weakness. Eyes: Negative for vision changes.  ENT: Negative for hoarseness, difficulty swallowing. CV: Negative for chest pain, angina, palpitations, dyspnea on exertion, peripheral edema.  Respiratory: Negative for dyspnea at rest, dyspnea on exertion, cough, sputum, wheezing.  GI: See history of present illness. Endo: Negative for unusual weight change.    Physical Exam: BP 147/96 mmHg  Pulse 102  Temp(Src) 96.7 F (35.9 C) (Oral)  Ht 5\' 9"  (1.753 m)  Wt 144 lb (65.318 kg)  BMI 21.26 kg/m2 General:   Alert and oriented. Pleasant and cooperative. Well-nourished and well-developed.  Head:  Normocephalic and atraumatic. Eyes:  Without icterus, sclera clear and conjunctiva pink.  Ears:  Normal auditory acuity. Cardiovascular:  S1, S2 present without murmurs appreciated. Normal pulses noted. Extremities without clubbing or edema. Respiratory:  Clear to auscultation bilaterally. No wheezes, rales, or rhonchi. No distress.  Gastrointestinal:  +BS, soft, and non-distended. Mild TTP epigastric area. No HSM noted. No guarding or rebound. No masses appreciated.  Rectal:  Deferred  Neurologic:  Alert and oriented x4;  grossly normal neurologically. Psych:  Alert and cooperative. Normal mood and affect.    10/30/2014 10:47 AM

## 2014-10-30 NOTE — Assessment & Plan Note (Signed)
Patient with persistent dyspepsia symptoms on Prilosec twice a day and Carafate. He was switched to [place] and provided samples. He states this seems to be working a little better for him but still has breakthrough symptoms. At this point we will proceed with an endoscopy to further evaluate.  Proceed with EGD in the OR with Dr. Gala Romney in near future: the risks, benefits, and alternatives have been discussed with the patient in detail. The patient states understanding and desires to proceed.  Patient is on narcotic pain medication, muscle relaxer, and Ambien for insomnia. Also has high levels of anxiety. For these reasons we will proceed with the procedure and the OR on propofol/MAC

## 2014-11-06 ENCOUNTER — Telehealth: Payer: Self-pay

## 2014-11-06 ENCOUNTER — Other Ambulatory Visit: Payer: Self-pay

## 2014-11-06 NOTE — Telephone Encounter (Signed)
Tried to do a PA for dexilant. PA was denied by the insurance, pt must try and fail: omeprazole, pantoprazole, lansoprazole, and rabeprazole. Pt has tried- omeprazole and pantoprazole. He has to try and fail the other 2 before they will pay for dexilant.

## 2014-11-07 ENCOUNTER — Other Ambulatory Visit: Payer: Self-pay | Admitting: Nurse Practitioner

## 2014-11-07 MED ORDER — LANSOPRAZOLE 30 MG PO CPDR: 30.0000 mg | DELAYED_RELEASE_CAPSULE | Freq: Every day | ORAL | Status: DC

## 2014-11-07 NOTE — Telephone Encounter (Signed)
Please notify patient. I will send in Prevacid 15 mg daily for 4 weeks to try. Have him call us in 3 weeks and notify whether it's effective or not. If not, we'll move on to Aciphex.

## 2014-11-07 NOTE — Telephone Encounter (Signed)
Pt is aware. He has been instructed to stop the pantoprzole and try lansoprazole and if it doesn't help him, he is to call me back and let me know so we can send in generic aciphex.

## 2014-11-14 NOTE — Patient Instructions (Signed)
Daniel Atkinson  11/14/2014     @PREFPERIOPPHARMACY @   Your procedure is scheduled on  11/22/2014   Report to [facility] at  13  A.M.  Call this number if you have problems the morning of surgery:  [phone]   Remember:  Do not eat food or drink liquids after midnight.  Take these medicines the morning of surgery with A SIP OF WATER  Flexaril, lisinopril, zofran, protonix, ultram.   Do not wear jewelry, make-up or nail polish.  Do not wear lotions, powders, or perfumes.    Do not shave 48 hours prior to surgery.  Men may shave face and neck.  Do not bring valuables to the hospital.  [facility] is not responsible for any belongings or valuables.  Contacts, dentures or bridgework may not be worn into surgery.  Leave your suitcase in the car.  After surgery it may be brought to your room.  For patients admitted to the hospital, discharge time will be determined by your treatment team.  Patients discharged the day of surgery will not be allowed to drive home.   Name and phone number of your driver:   family Special instructions:  Follow the diet and prep instructions given to you by Dr Roseanne Kaufman office.  Please read over the following fact sheets that you were given. Pain Booklet, Coughing and Deep Breathing, Total Joint Packet, MRSA Information and Surgical Site Infection Prevention      Esophagogastroduodenoscopy Esophagogastroduodenoscopy (EGD) is a procedure to examine the lining of the esophagus, stomach, and first part of the small intestine (duodenum). A long, flexible, lighted tube with a camera attached (endoscope) is inserted down the throat to view these organs. This procedure is done to detect problems or abnormalities, such as inflammation, bleeding, ulcers, or growths, in order to treat them. The procedure lasts about 5-20 minutes. It is usually an outpatient procedure, but it may need to be performed in emergency cases in the hospital. LET YOUR  CAREGIVER KNOW ABOUT:   Allergies to food or medicine.  All medicines you are taking, including vitamins, herbs, eyedrops, and over-the-counter medicines and creams.  Use of steroids (by mouth or creams).  Previous problems you or members of your family have had with the use of anesthetics.  Any blood disorders you have.  Previous surgeries you have had.  Other health problems you have.  Possibility of pregnancy, if this applies. RISKS AND COMPLICATIONS  Generally, EGD is a safe procedure. However, as with any procedure, complications can occur. Possible complications include:  Infection.  Bleeding.  Tearing (perforation) of the esophagus, stomach, or duodenum.  Difficulty breathing or not being able to breath.  Excessive sweating.  Spasms of the larynx.  Slowed heartbeat.  Low blood pressure. BEFORE THE PROCEDURE  Do not eat or drink anything for 6-8 hours before the procedure or as directed by your caregiver.  Ask your caregiver about changing or stopping your regular medicines.  If you wear dentures, be prepared to remove them before the procedure.  Arrange for someone to drive you home after the procedure. PROCEDURE   A vein will be accessed to give medicines and fluids. A medicine to relax you (sedative) and a pain reliever will be given through that access into the vein.  A numbing medicine (local anesthetic) may be sprayed on your throat for comfort and to stop you from gagging or coughing.  A mouth guard may be placed in your mouth  to protect your teeth and to keep you from biting on the endoscope.  You will be asked to lie on your left side.  The endoscope is inserted down your throat and into the esophagus, stomach, and duodenum.  Air is put through the endoscope to allow your caregiver to view the lining of your esophagus clearly.  The esophagus, stomach, and duodenum is then examined. During the exam, your caregiver may:  Remove tissue to be  examined under a microscope (biopsy) for inflammation, infection, or other medical problems.  Remove growths.  Remove objects (foreign bodies) that are stuck.  Treat any bleeding with medicines or other devices that stop tissues from bleeding (hot cautery, clipping devices).  Widen (dilate) or stretch narrowed areas of the esophagus and stomach.  The endoscope will then be withdrawn. AFTER THE PROCEDURE  You will be taken to a recovery area to be monitored. You will be able to go home once you are stable and alert.  Do not eat or drink anything until the local anesthetic and numbing medicines have worn off. You may choke.  It is normal to feel bloated, have pain with swallowing, or have a sore throat for a short time. This will wear off.  Your caregiver should be able to discuss his or her findings with you. It will take longer to discuss the test results if any biopsies were taken. Document Released: 07/10/2004 Document Revised: 07/24/2013 Document Reviewed: 02/10/2012 [facility] Patient Information 2015 [place], [place]. This information is not intended to replace advice given to you by your health care provider. Make sure you discuss any questions you have with your health care provider. Colonoscopy A colonoscopy is an exam to look at the entire large intestine (colon). This exam can help find problems such as tumors, polyps, inflammation, and areas of bleeding. The exam takes about 1 hour.  LET [facility] CARE PROVIDER KNOW ABOUT:   Any allergies you have.  All medicines you are taking, including vitamins, herbs, eye drops, creams, and over-the-counter medicines.  Previous problems you or members of your family have had with the use of anesthetics.  Any blood disorders you have.  Previous surgeries you have had.  Medical conditions you have. RISKS AND COMPLICATIONS  Generally, this is a safe procedure. However, as with any procedure, complications can occur. Possible  complications include:  Bleeding.  Tearing or rupture of the colon wall.  Reaction to medicines given during the exam.  Infection (rare). BEFORE THE PROCEDURE   Ask your health care provider about changing or stopping your regular medicines.  You may be prescribed an oral bowel prep. This involves drinking a large amount of medicated liquid, starting the day before your procedure. The liquid will cause you to have multiple loose stools until your stool is almost clear or light green. This cleans out your colon in preparation for the procedure.  Do not eat or drink anything else once you have started the bowel prep, unless your health care provider tells you it is safe to do so.  Arrange for someone to drive you home after the procedure. PROCEDURE   You will be given medicine to help you relax (sedative).  You will lie on your side with your knees bent.  A long, flexible tube with a light and camera on the end (colonoscope) will be inserted through the rectum and into the colon. The camera sends video back to a computer screen as it moves through the colon. The colonoscope also releases carbon  dioxide gas to inflate the colon. This helps your health care provider see the area better.  During the exam, your health care provider may take a small tissue sample (biopsy) to be examined under a microscope if any abnormalities are found.  The exam is finished when the entire colon has been viewed. AFTER THE PROCEDURE   Do not drive for 24 hours after the exam.  You may have a small amount of blood in your stool.  You may pass moderate amounts of gas and have mild abdominal cramping or bloating. This is caused by the gas used to inflate your colon during the exam.  Ask when your test results will be ready and how you will get your results. Make sure you get your test results. Document Released: 03/06/2000 Document Revised: 12/28/2012 Document Reviewed: 11/14/2012 [facility] Patient  Information 2015 [place], [place]. This information is not intended to replace advice given to you by your health care provider. Make sure you discuss any questions you have with your health care provider. PATIENT INSTRUCTIONS POST-ANESTHESIA  IMMEDIATELY FOLLOWING SURGERY:  Do not drive or operate machinery for the first twenty four hours after surgery.  Do not make any important decisions for twenty four hours after surgery or while taking narcotic pain medications or sedatives.  If you develop intractable nausea and vomiting or a severe headache please notify your doctor immediately.  FOLLOW-UP:  Please make an appointment with your surgeon as instructed. You do not need to follow up with anesthesia unless specifically instructed to do so.  WOUND CARE INSTRUCTIONS (if applicable):  Keep a dry clean dressing on the anesthesia/puncture wound site if there is drainage.  Once the wound has quit draining you may leave it open to air.  Generally you should leave the bandage intact for twenty four hours unless there is drainage.  If the epidural site drains for more than 36-48 hours please call the anesthesia department.  QUESTIONS?:  Please feel free to call your physician or the hospital operator if you have any questions, and they will be happy to assist you.

## 2014-11-15 ENCOUNTER — Encounter (HOSPITAL_COMMUNITY): Payer: Self-pay

## 2014-11-15 ENCOUNTER — Encounter (HOSPITAL_COMMUNITY)
Admission: RE | Admit: 2014-11-15 | Discharge: 2014-11-15 | Disposition: A | Discharge status: Home / Self Care | Payer: BLUE CROSS/BLUE SHIELD | Source: Ambulatory Visit | Attending: Internal Medicine | Admitting: Internal Medicine

## 2014-11-15 DIAGNOSIS — Z01818 Encounter for other preprocedural examination: Secondary | ICD-10-CM | POA: Insufficient documentation

## 2014-11-15 DIAGNOSIS — K219 Gastro-esophageal reflux disease without esophagitis: Secondary | ICD-10-CM | POA: Insufficient documentation

## 2014-11-15 LAB — CBC WITH DIFFERENTIAL/PLATELET
Basophils Absolute: 0 10*3/uL (ref 0.0–0.1)
Basophils Relative: 0 % (ref 0–1)
Eosinophils Absolute: 0 10*3/uL (ref 0.0–0.7)
Eosinophils Relative: 0 % (ref 0–5)
HEMATOCRIT: 40.3 % (ref 39.0–52.0)
HEMOGLOBIN: 13.8 g/dL (ref 13.0–17.0)
LYMPHS ABS: 2.8 10*3/uL (ref 0.7–4.0)
LYMPHS PCT: 11 % — AB (ref 12–46)
MCH: 31 pg (ref 26.0–34.0)
MCHC: 34.2 g/dL (ref 30.0–36.0)
MCV: 90.6 fL (ref 78.0–100.0)
MONOS PCT: 4 % (ref 3–12)
Monocytes Absolute: 1 10*3/uL (ref 0.1–1.0)
NEUTROS ABS: 21.1 10*3/uL — AB (ref 1.7–7.7)
NEUTROS PCT: 85 % — AB (ref 43–77)
Platelets: 472 10*3/uL — ABNORMAL HIGH (ref 150–400)
RBC: 4.45 MIL/uL (ref 4.22–5.81)
RDW: 12.5 % (ref 11.5–15.5)
WBC: 24.8 10*3/uL — AB (ref 4.0–10.5)

## 2014-11-15 LAB — BASIC METABOLIC PANEL
Anion gap: 4 — ABNORMAL LOW (ref 5–15)
BUN: 13 mg/dL (ref 6–20)
CHLORIDE: 100 mmol/L — AB (ref 101–111)
CO2: 29 mmol/L (ref 22–32)
CREATININE: 1.2 mg/dL (ref 0.61–1.24)
Calcium: 9.4 mg/dL (ref 8.9–10.3)
GFR calc Af Amer: >60 mL/min (ref >60)
GFR calc non Af Amer: >60 mL/min (ref >60)
GLUCOSE: 108 mg/dL — AB (ref 65–99)
POTASSIUM: 4.5 mmol/L (ref 3.5–5.1)
SODIUM: 133 mmol/L — AB (ref 135–145)

## 2014-11-15 NOTE — Pre-Procedure Instructions (Signed)
Patient given information to sign up for my chart at home. 

## 2014-11-16 NOTE — Progress Notes (Signed)
PATIENT COMING 11/19/14 AT 11:30  AWARE OF DATE AND TIME

## 2014-11-19 ENCOUNTER — Encounter: Payer: Self-pay | Admitting: Gastroenterology

## 2014-11-19 ENCOUNTER — Ambulatory Visit (INDEPENDENT_AMBULATORY_CARE_PROVIDER_SITE_OTHER): Payer: BLUE CROSS/BLUE SHIELD | Admitting: Gastroenterology

## 2014-11-19 VITALS — BP 139/95 | HR 127 | Temp 98.1°F | Ht 69.0 in | Wt 138.0 lb

## 2014-11-19 DIAGNOSIS — K219 Gastro-esophageal reflux disease without esophagitis: Secondary | ICD-10-CM | POA: Diagnosis not present

## 2014-11-19 DIAGNOSIS — Z1211 Encounter for screening for malignant neoplasm of colon: Secondary | ICD-10-CM | POA: Diagnosis not present

## 2014-11-19 NOTE — Progress Notes (Signed)
Referring Provider: Ginger Organ Primary Care Physician:  Collene Mares, PA-C  Primary GI: Dr. Gala Romney   Chief Complaint  Patient presents with  . go over labs    HPI:   Daniel Atkinson is a 49 y.o. male presenting today with a history of GERD, epigastric pain, nausea, regurgitation. No prior colonoscopy. Last seen 10/30/14 and scheduled for colonoscopy/EGD with Propofol. However, pre-op labs showed new onset leukocytosis of 24.8. He is here for evaluation prior to procedure.   Noting nausea all weekend, pain in epigastric region. Was feeling better for last few weeks. When he was contacted about the elevated white count, he noted the onset of nausea. States Prevacid had helped with symptoms, but Friday symptoms restarted. States he had urine samples done at [place]. No fever. Sometimes feels warm. Having hot/cold flashes. No diarrhea. Felt constipated on Friday so took some mag citrate. Cleaned his system out. No problems urinating. States his throat felt sore the other day but went away. No cough. No nasal drainage. Nausea feels worse than before. Spitting up foamy emesis but not true vomiting. Appetite is decreased. Denies any night sweats.   CT August 22, 2014 with lobulated right renal stone vs cluster of small stones. No obstructive uropathy. Small hiatal hernia. US abdomen May 2016 without evidence of cholelithiasis.   Weight June 2016 150. Now 138. Denies SOB, chest pain. Pain sometimes radiates through to back.    Past Medical History  Diagnosis Date  . Hypertension   . Hiatal hernia   . GERD (gastroesophageal reflux disease)   . Peptic ulcer     Past Surgical History  Procedure Laterality Date  . Appendectomy      Current Outpatient Prescriptions  Medication Sig Dispense Refill  . acetaminophen (TYLENOL) 500 MG tablet Take 500 mg by mouth every 6 (six) hours as needed for mild pain.     Marland Kitchen bismuth subsalicylate (PEPTO BISMOL) 262 MG chewable tablet Chew 524 mg  by mouth as needed.    . docusate sodium (COLACE) 100 MG capsule Take 100 mg by mouth daily as needed for mild constipation.    . lansoprazole (PREVACID) 30 MG capsule Take 1 capsule (30 mg total) by mouth daily at 12 noon. 30 capsule 0  . lisinopril (PRINIVIL,ZESTRIL) 10 MG tablet Take 10 mg by mouth daily.  5  . magnesium citrate SOLN Take 1 Bottle by mouth as needed for severe constipation.    . Multiple Vitamin (MULTIVITAMIN) capsule Take 1 capsule by mouth daily.    . Multiple Vitamin (ONE-A-DAY MENS PO) Take 1 tablet by mouth daily.    . ondansetron (ZOFRAN ODT) 4 MG disintegrating tablet Take 1 tablet (4 mg total) by mouth every 8 (eight) hours as needed for nausea or vomiting. 20 tablet 0  . Potassium (POTASSIMIN PO) Take 1 tablet by mouth daily.    . Simethicone (MYLANTA GAS PO) Take 30 mLs by mouth as needed.    . sucralfate (CARAFATE) 1 G tablet Take 1 tablet (1 g total) by mouth 4 (four) times daily -  with meals and at bedtime. 30 tablet 0  . traMADol (ULTRAM) 50 MG tablet Take 50 mg by mouth every 6 (six) hours as needed for moderate pain.     Marland Kitchen zolpidem (AMBIEN) 10 MG tablet Take 1 tablet at bedtime as needed sleep.  1   No current facility-administered medications for this visit.    Allergies as of 11/19/2014 - Review Complete 11/19/2014  Allergen  Reaction Noted  . Hydrocodone Nausea And Vomiting 08/09/2014    Family History  Problem Relation Age of Onset  . Hypertension Mother   . Hypertension Father   . Colon cancer Neg Hx     Social History   Social History  . Marital Status: Married    Spouse Name: N/A  . Number of Children: N/A  . Years of Education: N/A   Social History Main Topics  . Smoking status: Former Smoker -- 1.00 packs/day for 15 years    Types: Cigarettes    Quit date: 11/15/2002  . Smokeless tobacco: Never Used     Comment: Quit x 12 years  . Alcohol Use: No  . Drug Use: No  . Sexual Activity: Yes    Birth Control/ Protection: None    Other Topics Concern  . None   Social History Narrative    Review of Systems: As mentioned in HPI  Physical Exam: BP 139/95 mmHg  Pulse 127  Temp(Src) 98.1 F (36.7 C)  Ht '5\' 9"'  (1.753 m)  Wt 138 lb (62.596 kg)  BMI 20.37 kg/m2 General:   Alert and oriented. Anxious but pleasant. Head:  Normocephalic and atraumatic. Eyes:  Conjuctiva clear without scleral icterus. Mouth:  Oral mucosa pink and moist. Good dentition. No lesions. Heart:  S1, S2 present without murmurs, rubs, or gallops. Abdomen:  +BS, soft, non-tender and non-distended. No rebound or guarding. No HSM or masses noted. Msk:  Symmetrical without gross deformities. Normal posture. Extremities:  Without edema. Neurologic:  Alert and  oriented x4;  grossly normal neurologically. Psych:  Alert and cooperative. Normal mood and affect.  Lab Results  Component Value Date   WBC 24.8* 11/15/2014   HGB 13.8 11/15/2014   HCT 40.3 11/15/2014   MCV 90.6 11/15/2014   PLT 472* 11/15/2014   OUTSIDE LABS 8/26: WBC 6.6, platelets 262. Tbili 0.4, Alk Phos 87, AST 37, ALT 64

## 2014-11-19 NOTE — Assessment & Plan Note (Signed)
Persistent dyspepsia on PPI and Carafate. Currently taking Prevacid. Associated nausea, regurgitation, and documented weight loss noted. Significant anxiety overlay. As of note, pre-op labs for EGD/TCS revealed new onset leukocytosis of 24.8; recheck the following day through PCP showed resolution of this with WBC in 6 range. Patient denies any significant changes in symptomatology, and it actually appears that his nausea worsened after hearing his lab results last week. Physical exam benign. Unclear source for leukocytosis, but this has resolved. Close monitoring but will plan on continuing with EGD as planned previously.   Proceed with upper endoscopy in the near future with Dr. Gala Romney. The risks, benefits, and alternatives have been discussed in detail with patient. They have stated understanding and desire to proceed.  Propofol due to polypharmacy Continue Prevacid

## 2014-11-19 NOTE — Assessment & Plan Note (Signed)
49 year old male with need for initial screening colonoscopy, without any concerning lower GI signs/symptoms. Due to polypharmacy, scheduled for the OR with Propofol.   Proceed with TCS with Dr. Gala Romney in near future: the risks, benefits, and alternatives have been discussed with the patient in detail. The patient states understanding and desires to proceed.

## 2014-11-19 NOTE — Patient Instructions (Signed)
I will be in touch later this afternoon. We are trying to get lab work. We will likely need more work-up. Further recommendations to follow.

## 2014-11-20 NOTE — Progress Notes (Signed)
cc'ed to pcp °

## 2014-11-22 ENCOUNTER — Ambulatory Visit (HOSPITAL_COMMUNITY)
Admission: RE | Admit: 2014-11-22 | Discharge: 2014-11-22 | Disposition: A | Discharge status: Home / Self Care | Payer: BLUE CROSS/BLUE SHIELD | Source: Ambulatory Visit | Attending: Internal Medicine | Admitting: Internal Medicine

## 2014-11-22 ENCOUNTER — Ambulatory Visit (HOSPITAL_COMMUNITY): Payer: BLUE CROSS/BLUE SHIELD | Admitting: Anesthesiology

## 2014-11-22 ENCOUNTER — Encounter (HOSPITAL_COMMUNITY): Payer: Self-pay | Admitting: *Deleted

## 2014-11-22 ENCOUNTER — Encounter (HOSPITAL_COMMUNITY)
Admission: RE | Disposition: A | Discharge status: Home / Self Care | Payer: Self-pay | Source: Ambulatory Visit | Attending: Internal Medicine

## 2014-11-22 DIAGNOSIS — K2950 Unspecified chronic gastritis without bleeding: Secondary | ICD-10-CM | POA: Diagnosis not present

## 2014-11-22 DIAGNOSIS — K3189 Other diseases of stomach and duodenum: Secondary | ICD-10-CM | POA: Insufficient documentation

## 2014-11-22 DIAGNOSIS — R110 Nausea: Secondary | ICD-10-CM | POA: Insufficient documentation

## 2014-11-22 DIAGNOSIS — Z79899 Other long term (current) drug therapy: Secondary | ICD-10-CM | POA: Insufficient documentation

## 2014-11-22 DIAGNOSIS — Z1211 Encounter for screening for malignant neoplasm of colon: Secondary | ICD-10-CM | POA: Diagnosis not present

## 2014-11-22 DIAGNOSIS — D122 Benign neoplasm of ascending colon: Secondary | ICD-10-CM | POA: Diagnosis not present

## 2014-11-22 DIAGNOSIS — R1013 Epigastric pain: Secondary | ICD-10-CM | POA: Diagnosis not present

## 2014-11-22 DIAGNOSIS — I10 Essential (primary) hypertension: Secondary | ICD-10-CM | POA: Diagnosis not present

## 2014-11-22 DIAGNOSIS — Z86010 Personal history of colonic polyps: Secondary | ICD-10-CM | POA: Insufficient documentation

## 2014-11-22 DIAGNOSIS — K219 Gastro-esophageal reflux disease without esophagitis: Secondary | ICD-10-CM | POA: Insufficient documentation

## 2014-11-22 HISTORY — PX: COLONOSCOPY WITH PROPOFOL: SHX5780

## 2014-11-22 HISTORY — PX: POLYPECTOMY: SHX5525

## 2014-11-22 HISTORY — PX: ESOPHAGOGASTRODUODENOSCOPY (EGD) WITH PROPOFOL: SHX5813

## 2014-11-22 HISTORY — PX: BIOPSY: SHX5522

## 2014-11-22 SURGERY — COLONOSCOPY WITH PROPOFOL
Anesthesia: Monitor Anesthesia Care

## 2014-11-22 MED ORDER — LIDOCAINE VISCOUS 2 % MT SOLN
15.0000 mL | Freq: Once | OROMUCOSAL | Status: AC
  Administered 2014-11-22: 15 mL via OROMUCOSAL
  Filled 2014-11-22: qty 15

## 2014-11-22 MED ORDER — FENTANYL CITRATE (PF) 100 MCG/2ML IJ SOLN
INTRAMUSCULAR | Status: DC | PRN
  Administered 2014-11-22 (×4): 25 ug via INTRAVENOUS

## 2014-11-22 MED ORDER — SODIUM CHLORIDE 0.9 % IJ SOLN
INTRAMUSCULAR | Status: AC
  Filled 2014-11-22: qty 10

## 2014-11-22 MED ORDER — LACTATED RINGERS IV SOLN
INTRAVENOUS | Status: DC | PRN
  Administered 2014-11-22 (×2): via INTRAVENOUS

## 2014-11-22 MED ORDER — FENTANYL CITRATE (PF) 100 MCG/2ML IJ SOLN
INTRAMUSCULAR | Status: AC
  Filled 2014-11-22: qty 2

## 2014-11-22 MED ORDER — PROPOFOL 10 MG/ML IV BOLUS
INTRAVENOUS | Status: AC
  Filled 2014-11-22: qty 20

## 2014-11-22 MED ORDER — MIDAZOLAM HCL 2 MG/2ML IJ SOLN
1.0000 mg | INTRAMUSCULAR | Status: DC | PRN
  Administered 2014-11-22 (×2): 2 mg via INTRAVENOUS

## 2014-11-22 MED ORDER — STERILE WATER FOR IRRIGATION IR SOLN
Status: DC | PRN
  Administered 2014-11-22: 1000 mL

## 2014-11-22 MED ORDER — FENTANYL CITRATE (PF) 100 MCG/2ML IJ SOLN
25.0000 ug | INTRAMUSCULAR | Status: AC
  Administered 2014-11-22 (×2): 25 ug via INTRAVENOUS

## 2014-11-22 MED ORDER — MIDAZOLAM HCL 5 MG/5ML IJ SOLN
INTRAMUSCULAR | Status: DC | PRN
  Administered 2014-11-22: 2 mg via INTRAVENOUS

## 2014-11-22 MED ORDER — FENTANYL CITRATE (PF) 100 MCG/2ML IJ SOLN
INTRAMUSCULAR | Status: AC
  Filled 2014-11-22: qty 4

## 2014-11-22 MED ORDER — EPHEDRINE SULFATE 50 MG/ML IJ SOLN
INTRAMUSCULAR | Status: AC
  Filled 2014-11-22: qty 1

## 2014-11-22 MED ORDER — PROPOFOL INFUSION 10 MG/ML OPTIME
INTRAVENOUS | Status: DC | PRN
  Administered 2014-11-22: 125 ug/kg/min via INTRAVENOUS
  Administered 2014-11-22: 11:00:00 via INTRAVENOUS

## 2014-11-22 MED ORDER — ONDANSETRON HCL 4 MG/2ML IJ SOLN
INTRAMUSCULAR | Status: AC
  Filled 2014-11-22: qty 2

## 2014-11-22 MED ORDER — LIDOCAINE HCL (CARDIAC) 10 MG/ML IV SOLN
INTRAVENOUS | Status: DC | PRN
  Administered 2014-11-22: 50 mg via INTRAVENOUS

## 2014-11-22 MED ORDER — EPHEDRINE SULFATE 50 MG/ML IJ SOLN
INTRAMUSCULAR | Status: DC | PRN
  Administered 2014-11-22: 10 mg via INTRAVENOUS
  Administered 2014-11-22: 5 mg via INTRAVENOUS

## 2014-11-22 MED ORDER — ONDANSETRON HCL 4 MG/2ML IJ SOLN
4.0000 mg | Freq: Once | INTRAMUSCULAR | Status: AC
  Administered 2014-11-22: 4 mg via INTRAVENOUS

## 2014-11-22 MED ORDER — MIDAZOLAM HCL 2 MG/2ML IJ SOLN
INTRAMUSCULAR | Status: AC
  Filled 2014-11-22: qty 2

## 2014-11-22 MED ORDER — MIDAZOLAM HCL 2 MG/2ML IJ SOLN
INTRAMUSCULAR | Status: AC
  Filled 2014-11-22: qty 4

## 2014-11-22 SURGICAL SUPPLY — 24 items
BLOCK BITE 60FR ADLT L/F BLUE (MISCELLANEOUS) ×3 IMPLANT
DEVICE CLIP HEMOSTAT 235CM (CLIP) IMPLANT
ELECT REM PT RETURN 9FT ADLT (ELECTROSURGICAL)
ELECTRODE REM PT RTRN 9FT ADLT (ELECTROSURGICAL) IMPLANT
FCP BXJMBJMB 240X2.8X (CUTTING FORCEPS)
FLOOR PAD 36X40 (MISCELLANEOUS)
FORCEPS BIOP RAD 4 LRG CAP 4 (CUTTING FORCEPS) ×6 IMPLANT
FORCEPS BIOP RJ4 240 W/NDL (CUTTING FORCEPS)
FORCEPS BXJMBJMB 240X2.8X (CUTTING FORCEPS) IMPLANT
FORMALIN 10 PREFIL 20ML (MISCELLANEOUS) ×6 IMPLANT
INJECTOR/SNARE I SNARE (MISCELLANEOUS) IMPLANT
KIT ENDO PROCEDURE PEN (KITS) ×3 IMPLANT
MANIFOLD NEPTUNE II (INSTRUMENTS) ×3 IMPLANT
NEEDLE SCLEROTHERAPY 25GX240 (NEEDLE) IMPLANT
PAD FLOOR 36X40 (MISCELLANEOUS) IMPLANT
PROBE APC STR FIRE (PROBE) IMPLANT
PROBE INJECTION GOLD (MISCELLANEOUS)
PROBE INJECTION GOLD 7FR (MISCELLANEOUS) IMPLANT
SNARE ROTATE MED OVAL 20MM (MISCELLANEOUS) IMPLANT
SNARE SHORT THROW 13M SML OVAL (MISCELLANEOUS) IMPLANT
SYR INFLATION 60ML (SYRINGE) IMPLANT
TRAP SPECIMEN MUCOUS 40CC (MISCELLANEOUS) IMPLANT
TUBING IRRIGATION ENDOGATOR (MISCELLANEOUS) ×3 IMPLANT
WATER STERILE IRR 1000ML POUR (IV SOLUTION) ×3 IMPLANT

## 2014-11-22 NOTE — Op Note (Signed)
[facility] [address] [place], [address]   ENDOSCOPY PROCEDURE REPORT  PATIENT: Daniel, Atkinson  MR#: [id number] BIRTHDATE: 31-Dec-1965 , 49  yrs. old GENDER: male ENDOSCOPIST: R.  Garfield Cornea, MD [facility] REFERRED BY:  Collene Mares, PA-C PROCEDURE DATE:  November 29, 2014 PROCEDURE:  EGD w/ biopsy INDICATIONS:  Refractory GERD/dyspepsia. MEDICATIONS: Deep sedation per Dr.  [facility] ASA CLASS:      Class II  CONSENT: The risks, benefits, limitations, alternatives and imponderables have been discussed.  The potential for biopsy, esophogeal dilation, etc. have also been reviewed.  Questions have been answered.  All parties agreeable.  Please see the history and physical in the medical record for more information.  DESCRIPTION OF PROCEDURE: After the risks benefits and alternatives of the procedure were thoroughly explained, informed consent was obtained.  The    endoscope was introduced through the mouth and advanced to the second portion of the duodenum , limited by Without limitations. The instrument was slowly withdrawn as the mucosa was fully examined. Estimated blood loss is zero unless otherwise noted in this procedure report.    Normal-appearing, patent tubular esophagus.  Stomach empty.  Mottled gastric mucosa.  No obvious infiltrative process.  No ulcer seen. Patent pylorus.  Normal-appearing first and second portion of the duodenum.  Biopsies of the abnormal gastric mucosa taken.  Retroflexed views revealed as previously described.     The scope was then withdrawn from the patient and the procedure completed.  COMPLICATIONS: There were no immediate complications.  ENDOSCOPIC IMPRESSION: Abnormal gastric mucosa of doubtful clinical significance?"status post biopsy  RECOMMENDATIONS: Stop Prevacid; begin trial of Dexilant 60 mg daily. Follow-up pathology. See colonoscopy report.  REPEAT EXAM:  eSigned:  R. Garfield Cornea, MD  Rosalita Chessman [facility] 2014/11/29 11:25 AM    CC:  CPT CODES: ICD CODES:  The ICD and CPT codes recommended by this software are interpretations from the data that the clinical staff has captured with the software.  The verification of the translation of this report to the ICD and CPT codes and modifiers is the sole responsibility of the health care institution and practicing physician where this report was generated.  [employer] will not be held responsible for the validity of the ICD and CPT codes included on this report.  AMA assumes no liability for data contained or not contained herein. CPT is a Designer, television/film set of the [employer].  PATIENT NAME:  Daniel, Atkinson MR#: [id number]

## 2014-11-22 NOTE — Interval H&P Note (Signed)
History and Physical Interval Note:  11/22/2014 10:32 AM  Daniel Atkinson  has presented today for surgery, with the diagnosis of GERD, screening colonoscopy  The various methods of treatment have been discussed with the patient and family. After consideration of risks, benefits and other options for treatment, the patient has consented to  Procedure(s) with comments: COLONOSCOPY WITH PROPOFOL (N/A) - 1100 ESOPHAGOGASTRODUODENOSCOPY (EGD) WITH PROPOFOL (N/A) as a surgical intervention .  The patient's history has been reviewed, patient examined, no change in status, stable for surgery.  I have reviewed the patient's chart and labs.  Questions were answered to the patient's satisfaction.     Robert Rourk  No change. EGD and colonoscopy today per plan.The risks, benefits, limitations, imponderables and alternatives regarding both EGD and colonoscopy have been reviewed with the patient. Questions have been answered. All parties agreeable.

## 2014-11-22 NOTE — H&P (View-Only) (Signed)
  Referring Provider: Mann, Elvan L, PA-C Primary Care Physician:  MANN, Demareon, PA-C Primary GI:  Dr. Rourk  Chief Complaint  Patient presents with  . Follow-up    reflux    HPI:   49-year-old male presents for follow-up on GERD, epigastric pain. At last visit he just recently been started on Protonix and was also taking Carafate 3-4 times a day. Advised to continue this regimen and allow enough time for the common effective. He called 2 months after visit stating still having symptoms and was given samples of Dexilant to try. This is about one week ago. Was also given Phenergan for nausea.  Today he states he's continuing to have symptoms. Symptoms include esophageal burning, nausea, spitting up, burping bitter material.  Denies vomiting, regurgitation, additional abdominal pain, hematochezia, melena. He has had darkened stools which were formed but he was on Pepto Bismol. Was recently seen in the ER for hypertension. Denies chest pain, dyspnea, dizziness, lightheadedness, syncope, near syncope. Denies any other upper or lower GI symptoms. He is not taking the Phenergan because he doesn't like it. He has never had a colonoscopy before.  Past Medical History  Diagnosis Date  . Hypertension   . Hiatal hernia   . GERD (gastroesophageal reflux disease)   . Peptic ulcer     Past Surgical History  Procedure Laterality Date  . Appendectomy      Current Outpatient Prescriptions  Medication Sig Dispense Refill  . acetaminophen (TYLENOL) 500 MG tablet Take 500 mg by mouth every 6 (six) hours as needed.    . bismuth subsalicylate (PEPTO BISMOL) 262 MG/15ML suspension Take 30 mLs by mouth every 6 (six) hours as needed for indigestion.    . cyclobenzaprine (FLEXERIL) 10 MG tablet Take 10 mg by mouth 3 (three) times daily as needed for muscle spasms. Only as needed    . dexlansoprazole (DEXILANT) 60 MG capsule Take 60 mg by mouth daily.    . dicyclomine (BENTYL) 20 MG tablet Take 1  tablet (20 mg total) by mouth every 6 (six) hours as needed for spasms (for abdominal cramping). 20 tablet 0  . docusate sodium (COLACE) 100 MG capsule Take 100 mg by mouth daily as needed for mild constipation.    . lisinopril (PRINIVIL,ZESTRIL) 10 MG tablet Take 10 mg by mouth daily.  5  . Multiple Vitamin (MULTIVITAMIN) capsule Take 1 capsule by mouth daily.    . ondansetron (ZOFRAN ODT) 4 MG disintegrating tablet Take 1 tablet (4 mg total) by mouth every 8 (eight) hours as needed for nausea or vomiting. 20 tablet 0  . pantoprazole (PROTONIX) 40 MG tablet Take 1 tablet (40 mg total) by mouth daily. (Patient taking differently: Take 40 mg by mouth 2 (two) times daily. ) 30 tablet 5  . Potassium (POTASSIMIN PO) Take 1 tablet by mouth daily.    . sucralfate (CARAFATE) 1 G tablet Take 1 tablet (1 g total) by mouth 4 (four) times daily -  with meals and at bedtime. 30 tablet 0  . traMADol (ULTRAM) 50 MG tablet Take 50 mg by mouth every 6 (six) hours as needed.    . zolpidem (AMBIEN) 10 MG tablet Take 1 tablet at bedtime as needed.  1  . alum & mag hydroxide-simeth (MAALOX/MYLANTA) 200-200-20 MG/5ML suspension Take 15 mLs by mouth every 6 (six) hours as needed for indigestion or heartburn.    . hydroxypropyl methylcellulose / hypromellose (ISOPTO TEARS / GONIOVISC) 2.5 % ophthalmic solution Place 1 drop into   both eyes as needed for dry eyes.    . polyethylene glycol (MIRALAX / GLYCOLAX) packet Take 17 g by mouth daily as needed for mild constipation.    . promethazine (PHENERGAN) 25 MG tablet Take 0.5 tablets (12.5 mg total) by mouth every 6 (six) hours as needed for nausea or vomiting. (Patient not taking: Reported on 10/30/2014) 30 tablet 0   No current facility-administered medications for this visit.    Allergies as of 10/30/2014 - Review Complete 10/30/2014  Allergen Reaction Noted  . Hydrocodone Nausea And Vomiting 08/09/2014    Family History  Problem Relation Age of Onset  . Hypertension  Mother   . Hypertension Father   . Colon cancer Neg Hx     History   Social History  . Marital Status: Married    Spouse Name: N/A  . Number of Children: N/A  . Years of Education: N/A   Social History Main Topics  . Smoking status: Former Smoker  . Smokeless tobacco: Never Used     Comment: Quit x 12 years  . Alcohol Use: No  . Drug Use: No  . Sexual Activity: Yes   Other Topics Concern  . None   Social History Narrative    Review of Systems: General: Negative for anorexia, weight loss, fever, chills, fatigue, weakness. Eyes: Negative for vision changes.  ENT: Negative for hoarseness, difficulty swallowing. CV: Negative for chest pain, angina, palpitations, dyspnea on exertion, peripheral edema.  Respiratory: Negative for dyspnea at rest, dyspnea on exertion, cough, sputum, wheezing.  GI: See history of present illness. Endo: Negative for unusual weight change.    Physical Exam: BP 147/96 mmHg  Pulse 102  Temp(Src) 96.7 F (35.9 C) (Oral)  Ht 5' 9" (1.753 m)  Wt 144 lb (65.318 kg)  BMI 21.26 kg/m2 General:   Alert and oriented. Pleasant and cooperative. Well-nourished and well-developed.  Head:  Normocephalic and atraumatic. Eyes:  Without icterus, sclera clear and conjunctiva pink.  Ears:  Normal auditory acuity. Cardiovascular:  S1, S2 present without murmurs appreciated. Normal pulses noted. Extremities without clubbing or edema. Respiratory:  Clear to auscultation bilaterally. No wheezes, rales, or rhonchi. No distress.  Gastrointestinal:  +BS, soft, and non-distended. Mild TTP epigastric area. No HSM noted. No guarding or rebound. No masses appreciated.  Rectal:  Deferred  Neurologic:  Alert and oriented x4;  grossly normal neurologically. Psych:  Alert and cooperative. Normal mood and affect.    10/30/2014 10:47 AM  

## 2014-11-22 NOTE — Discharge Instructions (Signed)
Colonoscopy Discharge Instructions  Read the instructions outlined below and refer to this sheet in the next few weeks. These discharge instructions provide you with general information on caring for yourself after you leave the hospital. Your doctor may also give you specific instructions. While your treatment has been planned according to the most current medical practices available, unavoidable complications occasionally occur. If you have any problems or questions after discharge, call Dr. Gala Romney at [phone]. ACTIVITY  You may resume your regular activity, but move at a slower pace for the next 24 hours.   Take frequent rest periods for the next 24 hours.   Walking will help get rid of the air and reduce the bloated feeling in your belly (abdomen).   No driving for 24 hours (because of the medicine (anesthesia) used during the test).    Do not sign any important legal documents or operate any machinery for 24 hours (because of the anesthesia used during the test).  NUTRITION  Drink plenty of fluids.   You may resume your normal diet as instructed by your doctor.   Begin with a light meal and progress to your normal diet. Heavy or fried foods are harder to digest and may make you feel sick to your stomach (nauseated).   Avoid alcoholic beverages for 24 hours or as instructed.  MEDICATIONS  You may resume your normal medications unless your doctor tells you otherwise.  WHAT YOU CAN EXPECT TODAY  Some feelings of bloating in the abdomen.   Passage of more gas than usual.   Spotting of blood in your stool or on the toilet paper.  IF YOU HAD POLYPS REMOVED DURING THE COLONOSCOPY:  No aspirin products for 7 days or as instructed.   No alcohol for 7 days or as instructed.   Eat a soft diet for the next 24 hours.  FINDING OUT THE RESULTS OF YOUR TEST Not all test results are available during your visit. If your test results are not back during the visit, make an appointment  with your caregiver to find out the results. Do not assume everything is normal if you have not heard from your caregiver or the medical facility. It is important for you to follow up on all of your test results.  SEEK IMMEDIATE MEDICAL ATTENTION IF:  You have more than a spotting of blood in your stool.   Your belly is swollen (abdominal distention).   You are nauseated or vomiting.   You have a temperature over 101.   You have abdominal pain or discomfort that is severe or gets worse throughout the day.     Follow-up information provided  GERD information provided  Colon polyp information provided  Stop Prevacid; begin a 3 week trial of Dexilant 60 mg daily-go by my office for free samples  Colon Polyps Polyps are lumps of extra tissue growing inside the body. Polyps can grow in the large intestine (colon). Most colon polyps are noncancerous (benign). However, some colon polyps can become cancerous over time. Polyps that are larger than a pea may be harmful. To be safe, caregivers remove and test all polyps. CAUSES  Polyps form when mutations in the genes cause your cells to grow and divide even though no more tissue is needed. RISK FACTORS There are a number of risk factors that can increase your chances of getting colon polyps. They include:  Being older than 50 years.  Family history of colon polyps or colon cancer.  Long-term colon diseases, such  as colitis or Crohn disease.  Being overweight.  Smoking.  Being inactive.  Drinking too much alcohol. SYMPTOMS  Most small polyps do not cause symptoms. If symptoms are present, they may include:  Blood in the stool. The stool may look dark red or black.  Constipation or diarrhea that lasts longer than 1 week. DIAGNOSIS People often do not know they have polyps until their caregiver finds them during a regular checkup. Your caregiver can use 4 tests to check for polyps:  Digital rectal exam. The caregiver wears  gloves and feels inside the rectum. This test would find polyps only in the rectum.  Barium enema. The caregiver puts a liquid called barium into your rectum before taking X-rays of your colon. Barium makes your colon look white. Polyps are dark, so they are easy to see in the X-ray pictures.  Sigmoidoscopy. A thin, flexible tube (sigmoidoscope) is placed into your rectum. The sigmoidoscope has a light and tiny camera in it. The caregiver uses the sigmoidoscope to look at the last third of your colon.  Colonoscopy. This test is like sigmoidoscopy, but the caregiver looks at the entire colon. This is the most common method for finding and removing polyps. TREATMENT  Any polyps will be removed during a sigmoidoscopy or colonoscopy. The polyps are then tested for cancer. PREVENTION  To help lower your risk of getting more colon polyps:  Eat plenty of fruits and vegetables. Avoid eating fatty foods.  Do not smoke.  Avoid drinking alcohol.  Exercise every day.  Lose weight if recommended by your caregiver.  Eat plenty of calcium and folate. Foods that are rich in calcium include milk, cheese, and broccoli. Foods that are rich in folate include chickpeas, kidney beans, and spinach. HOME CARE INSTRUCTIONS Keep all follow-up appointments as directed by your caregiver. You may need periodic exams to check for polyps. SEEK MEDICAL CARE IF: You notice bleeding during a bowel movement. Document Released: 12/04/2003 Document Revised: 06/01/2011 Document Reviewed: 05/19/2011 [facility] Patient Information 2015 [place], [place]. This information is not intended to replace advice given to you by your health care provider. Make sure you discuss any questions you have with your health care provider. Gastroesophageal Reflux Disease, Adult Gastroesophageal reflux disease (GERD) happens when acid from your stomach flows up into the esophagus. When acid comes in contact with the esophagus, the acid causes  soreness (inflammation) in the esophagus. Over time, GERD may create small holes (ulcers) in the lining of the esophagus. CAUSES   Increased body weight. This puts pressure on the stomach, making acid rise from the stomach into the esophagus.  Smoking. This increases acid production in the stomach.  Drinking alcohol. This causes decreased pressure in the lower esophageal sphincter (valve or ring of muscle between the esophagus and stomach), allowing acid from the stomach into the esophagus.  Late evening meals and a full stomach. This increases pressure and acid production in the stomach.  A malformed lower esophageal sphincter. Sometimes, no cause is found. SYMPTOMS   Burning pain in the lower part of the mid-chest behind the breastbone and in the mid-stomach area. This may occur twice a week or more often.  Trouble swallowing.  Sore throat.  Dry cough.  Asthma-like symptoms including chest tightness, shortness of breath, or wheezing. DIAGNOSIS  Your caregiver may be able to diagnose GERD based on your symptoms. In some cases, X-rays and other tests may be done to check for complications or to check the condition of your stomach and  esophagus. TREATMENT  Your caregiver may recommend over-the-counter or prescription medicines to help decrease acid production. Ask your caregiver before starting or adding any new medicines.  HOME CARE INSTRUCTIONS   Change the factors that you can control. Ask your caregiver for guidance concerning weight loss, quitting smoking, and alcohol consumption.  Avoid foods and drinks that make your symptoms worse, such as:  Caffeine or alcoholic drinks.  Chocolate.  Peppermint or mint flavorings.  Garlic and onions.  Spicy foods.  Citrus fruits, such as oranges, lemons, or limes.  Tomato-based foods such as sauce, chili, salsa, and pizza.  Fried and fatty foods.  Avoid lying down for the 3 hours prior to your bedtime or prior to taking a  nap.  Eat small, frequent meals instead of large meals.  Wear loose-fitting clothing. Do not wear anything tight around your waist that causes pressure on your stomach.  Raise the head of your bed 6 to 8 inches with wood blocks to help you sleep. Extra pillows will not help.  Only take over-the-counter or prescription medicines for pain, discomfort, or fever as directed by your caregiver.  Do not take aspirin, ibuprofen, or other nonsteroidal anti-inflammatory drugs (NSAIDs). SEEK IMMEDIATE MEDICAL CARE IF:   You have pain in your arms, neck, jaw, teeth, or back.  Your pain increases or changes in intensity or duration.  You develop nausea, vomiting, or sweating (diaphoresis).  You develop shortness of breath, or you faint.  Your vomit is green, yellow, black, or looks like coffee grounds or blood.  Your stool is red, bloody, or black. These symptoms could be signs of other problems, such as heart disease, gastric bleeding, or esophageal bleeding. MAKE SURE YOU:   Understand these instructions.  Will watch your condition.  Will get help right away if you are not doing well or get worse. Document Released: 12/17/2004 Document Revised: 06/01/2011 Document Reviewed: 09/26/2010 [facility] Patient Information 2015 [place], [place]. This information is not intended to replace advice given to you by your health care provider. Make sure you discuss any questions you have with your health care provider.

## 2014-11-22 NOTE — Anesthesia Postprocedure Evaluation (Signed)
  Anesthesia Post-op Note  Patient: Daniel Atkinson  Procedure(s) Performed: Procedure(s) with comments: COLONOSCOPY WITH PROPOFOL (N/A) - cecum time in 1109  time out 1117  total time 8 minutes ESOPHAGOGASTRODUODENOSCOPY (EGD) WITH PROPOFOL (N/A) - procedure 1 BIOPSY (N/A) - gastric,  POLYPECTOMY (N/A) - ascending colon   Patient Location: PACU  Anesthesia Type:MAC  Level of Consciousness: awake, alert , oriented and patient cooperative  Airway and Oxygen Therapy: Patient Spontanous Breathing and Patient connected to face mask oxygen  Post-op Pain: none  Post-op Assessment: Post-op Vital signs reviewed, Patient's Cardiovascular Status Stable, Respiratory Function Stable, Patent Airway and No signs of Nausea or vomiting              Post-op Vital Signs: Reviewed and stable  Last Vitals:  Filed Vitals:   11/22/14 1010  BP: 140/100  Resp: 10    Complications: No apparent anesthesia complications

## 2014-11-22 NOTE — Anesthesia Preprocedure Evaluation (Addendum)
Anesthesia Evaluation  Patient identified by MRN, date of birth, ID band  Reviewed: Allergy & Precautions, NPO status , Patient's Chart, lab work & pertinent test results  Airway Mallampati: I  TM Distance: >3 FB     Dental  (+) Teeth Intact   Pulmonary former smoker,  breath sounds clear to auscultation        Cardiovascular hypertension, Pt. on medications Rhythm:Regular Rate:Normal     Neuro/Psych PSYCHIATRIC DISORDERS Anxiety    GI/Hepatic hiatal hernia, PUD, GERD-  Medicated,  Endo/Other    Renal/GU      Musculoskeletal   Abdominal   Peds  Hematology   Anesthesia Other Findings   Reproductive/Obstetrics                            Anesthesia Physical Anesthesia Plan  ASA: II  Anesthesia Plan: MAC   Post-op Pain Management:    Induction: Intravenous  Airway Management Planned: Simple Face Mask  Additional Equipment:   Intra-op Plan:   Post-operative Plan:   Informed Consent: I have reviewed the patients History and Physical, chart, labs and discussed the procedure including the risks, benefits and alternatives for the proposed anesthesia with the patient or authorized representative who has indicated his/her understanding and acceptance.     Plan Discussed with:   Anesthesia Plan Comments:         Anesthesia Quick Evaluation

## 2014-11-22 NOTE — Transfer of Care (Signed)
Immediate Anesthesia Transfer of Care Note  Patient: Daniel Atkinson  Procedure(s) Performed: Procedure(s) with comments: COLONOSCOPY WITH PROPOFOL (N/A) - cecum time in 1109  time out 1117  total time 8 minutes ESOPHAGOGASTRODUODENOSCOPY (EGD) WITH PROPOFOL (N/A) - procedure 1 BIOPSY (N/A) - gastric,  POLYPECTOMY (N/A) - ascending colon   Patient Location: PACU  Anesthesia Type:MAC  Level of Consciousness: awake, alert , oriented and patient cooperative  Airway & Oxygen Therapy: Patient Spontanous Breathing and Patient connected to face mask oxygen  Post-op Assessment: Report given to RN and Post -op Vital signs reviewed and stable  Post vital signs: Reviewed and stable  Last Vitals:  Filed Vitals:   11/22/14 1010  BP: 140/100  Resp: 10    Complications: No apparent anesthesia complications

## 2014-11-22 NOTE — Anesthesia Procedure Notes (Signed)
Procedure Name: MAC Date/Time: 11/22/2014 10:38 AM Performed by: Andree Elk, AMY A Pre-anesthesia Checklist: Patient identified, Timeout performed, Emergency Drugs available, Suction available and Patient being monitored Oxygen Delivery Method: Simple face mask

## 2014-11-22 NOTE — Op Note (Signed)
[facility] [address] [place], [address]   COLONOSCOPY PROCEDURE REPORT  PATIENT: Daniel, Atkinson  MR#: [id number] BIRTHDATE: 27-Dec-1965 , 49  yrs. old GENDER: male ENDOSCOPIST: R.  Garfield Cornea, MD FACP [facility] REFERRED QX:[id number] Collene Mares, PA-C PROCEDURE DATE:  12-04-14 PROCEDURE:   Colonoscopy with snare polypectomy INDICATIONS:First ever average risk colorectal cancer screening examination. MEDICATIONS: Deep sedation per Dr.  [facility] ASA CLASS:       Class II  CONSENT: The risks, benefits, alternatives and imponderables including but not limited to bleeding, perforation as well as the possibility of a missed lesion have been reviewed.  The potential for biopsy, lesion removal, etc. have also been discussed. Questions have been answered.  All parties agreeable.  Please see the history and physical in the medical record for more information.  DESCRIPTION OF PROCEDURE:   After the risks benefits and alternatives of the procedure were thoroughly explained, informed consent was obtained.  The digital rectal exam revealed no abnormalities of the rectum.   The     endoscope was introduced through the anus and advanced to the cecum, which was identified by both the appendix and ileocecal valve. No adverse events experienced.   The quality of the prep was adequate  The instrument was then slowly withdrawn as the colon was fully examined. Estimated blood loss is zero unless otherwise noted in this procedure report.      COLON FINDINGS: Normal-appearing rectal mucosa.  . Small rectal vault. Unable to retroflex. Rectal mucosa seen well on?"face.  (1) 5 mm pedunculated polyp in the mid ascending segment; otherwise, the remainder of the colonic mucosa.  The above-mentioned polyp was cold snare removed.  Retroflexion was not performed. .  Withdrawal time=8 minutes 0 seconds.  The scope was withdrawn and the procedure  completed. COMPLICATIONS: There were no immediate complications.  ENDOSCOPIC IMPRESSION: Single colonic polyp?"removed as described above.  RECOMMENDATIONS: Follow-up on pathology. See EGD report  eSigned:  R. Garfield Cornea, MD Rosalita Chess[facility] 12/04/2014 11:29 AM   cc:  CPT CODES: ICD CODES:  The ICD and CPT codes recommended by this software are interpretations from the data that the clinical staff has captured with the software.  The verification of the translation of this report to the ICD and CPT codes and modifiers is the sole responsibility of the health care institution and practicing physician where this report was generated.  [employer] will not be held responsible for the validity of the ICD and CPT codes included on this report.  AMA assumes no liability for data contained or not contained herein. CPT is a Designer, television/film set of the [employer].

## 2014-11-23 ENCOUNTER — Encounter (HOSPITAL_COMMUNITY): Payer: Self-pay | Admitting: Internal Medicine

## 2014-11-26 ENCOUNTER — Encounter (HOSPITAL_COMMUNITY): Payer: Self-pay | Admitting: Emergency Medicine

## 2014-11-26 ENCOUNTER — Emergency Department (HOSPITAL_COMMUNITY): Payer: BLUE CROSS/BLUE SHIELD

## 2014-11-26 ENCOUNTER — Emergency Department (HOSPITAL_COMMUNITY)
Admission: EM | Admit: 2014-11-26 | Discharge: 2014-11-26 | Disposition: A | Discharge status: Home / Self Care | Payer: BLUE CROSS/BLUE SHIELD | Attending: Emergency Medicine | Admitting: Emergency Medicine

## 2014-11-26 DIAGNOSIS — R0789 Other chest pain: Secondary | ICD-10-CM | POA: Insufficient documentation

## 2014-11-26 DIAGNOSIS — I10 Essential (primary) hypertension: Secondary | ICD-10-CM | POA: Diagnosis not present

## 2014-11-26 DIAGNOSIS — R000 Tachycardia, unspecified: Secondary | ICD-10-CM | POA: Diagnosis not present

## 2014-11-26 DIAGNOSIS — F419 Anxiety disorder, unspecified: Secondary | ICD-10-CM | POA: Diagnosis not present

## 2014-11-26 DIAGNOSIS — K219 Gastro-esophageal reflux disease without esophagitis: Secondary | ICD-10-CM | POA: Diagnosis not present

## 2014-11-26 DIAGNOSIS — R110 Nausea: Secondary | ICD-10-CM | POA: Diagnosis not present

## 2014-11-26 DIAGNOSIS — Z87891 Personal history of nicotine dependence: Secondary | ICD-10-CM | POA: Insufficient documentation

## 2014-11-26 DIAGNOSIS — Z79899 Other long term (current) drug therapy: Secondary | ICD-10-CM | POA: Diagnosis not present

## 2014-11-26 DIAGNOSIS — R079 Chest pain, unspecified: Secondary | ICD-10-CM

## 2014-11-26 DIAGNOSIS — R202 Paresthesia of skin: Secondary | ICD-10-CM | POA: Diagnosis not present

## 2014-11-26 HISTORY — DX: Headache, unspecified: R51.9

## 2014-11-26 HISTORY — DX: Headache: R51

## 2014-11-26 HISTORY — DX: Anxiety disorder, unspecified: F41.9

## 2014-11-26 LAB — CBC WITH DIFFERENTIAL/PLATELET
BASOS ABS: 0 10*3/uL (ref 0.0–0.1)
BASOS PCT: 0 % (ref 0–1)
EOS ABS: 0 10*3/uL (ref 0.0–0.7)
EOS PCT: 0 % (ref 0–5)
HCT: 43.7 % (ref 39.0–52.0)
Hemoglobin: 15.7 g/dL (ref 13.0–17.0)
Lymphocytes Relative: 30 % (ref 12–46)
Lymphs Abs: 1.7 10*3/uL (ref 0.7–4.0)
MCH: 31.2 pg (ref 26.0–34.0)
MCHC: 35.9 g/dL (ref 30.0–36.0)
MCV: 86.7 fL (ref 78.0–100.0)
MONO ABS: 0.5 10*3/uL (ref 0.1–1.0)
Monocytes Relative: 9 % (ref 3–12)
Neutro Abs: 3.4 10*3/uL (ref 1.7–7.7)
Neutrophils Relative %: 61 % (ref 43–77)
PLATELETS: 237 10*3/uL (ref 150–400)
RBC: 5.04 MIL/uL (ref 4.22–5.81)
RDW: 12.2 % (ref 11.5–15.5)
WBC: 5.7 10*3/uL (ref 4.0–10.5)

## 2014-11-26 LAB — COMPREHENSIVE METABOLIC PANEL
ALBUMIN: 4.2 g/dL (ref 3.5–5.0)
ALT: 56 U/L (ref 17–63)
ANION GAP: 6 (ref 5–15)
AST: 37 U/L (ref 15–41)
Alkaline Phosphatase: 74 U/L (ref 38–126)
BUN: 15 mg/dL (ref 6–20)
CHLORIDE: 99 mmol/L — AB (ref 101–111)
CO2: 30 mmol/L (ref 22–32)
Calcium: 9.2 mg/dL (ref 8.9–10.3)
Creatinine, Ser: 1.25 mg/dL — ABNORMAL HIGH (ref 0.61–1.24)
GFR calc Af Amer: >60 mL/min (ref >60)
GFR calc non Af Amer: >60 mL/min (ref >60)
GLUCOSE: 112 mg/dL — AB (ref 65–99)
POTASSIUM: 4.3 mmol/L (ref 3.5–5.1)
Sodium: 135 mmol/L (ref 135–145)
Total Bilirubin: 0.6 mg/dL (ref 0.3–1.2)
Total Protein: 7.6 g/dL (ref 6.5–8.1)

## 2014-11-26 LAB — TROPONIN I: Troponin I: <0.03 ng/mL (ref <0.031)

## 2014-11-26 MED ORDER — GI COCKTAIL ~~LOC~~
30.0000 mL | Freq: Once | ORAL | Status: AC
  Administered 2014-11-26: 30 mL via ORAL
  Filled 2014-11-26: qty 30

## 2014-11-26 MED ORDER — PROMETHAZINE HCL 25 MG PO TABS: 25.0000 mg | ORAL_TABLET | Freq: Four times a day (QID) | ORAL | Status: DC | PRN

## 2014-11-26 NOTE — Discharge Instructions (Signed)
°Emergency Department Resource Guide °1) Find a Doctor and Pay Out of Pocket °Although you won't have to find out who is covered by your insurance plan, it is a good idea to ask around and get recommendations. You will then need to call the office and see if the doctor you have chosen will accept you as a new patient and what types of options they offer for patients who are self-pay. Some doctors offer discounts or will set up payment plans for their patients who do not have insurance, but you will need to ask so you aren't surprised when you get to your appointment. ° °2) Contact Your Local Health Department °Not all health departments have doctors that can see patients for sick visits, but many do, so it is worth a call to see if yours does. If you don't know where your local health department is, you can check in your phone book. The CDC also has a tool to help you locate your state's health department, and many state websites also have listings of all of their local health departments. ° °3) Find a Walk-in Clinic °If your illness is not likely to be very severe or complicated, you may want to try a walk in clinic. These are popping up all over the country in pharmacies, drugstores, and shopping centers. They're usually staffed by nurse practitioners or physician assistants that have been trained to treat common illnesses and complaints. They're usually fairly quick and inexpensive. However, if you have serious medical issues or chronic medical problems, these are probably not your best option. ° °No Primary Care Doctor: °- Call [facility] at  832-8000 - they can help you locate a primary care doctor that  accepts your insurance, provides certain services, etc. °- Physician Referral Service- 1-800-533-3463 ° °Chronic Pain Problems: °Organization         Address  Phone   Notes  °[facility]  (336) 297-2271 Patients need to be referred by their primary care doctor.  ° °Medication  Assistance: °Organization         Address  Phone   Notes  °[place] [facility] [address] °[place], [place] [address] (336) 641-8030 --Must be a resident of [place] °-- Must have NO insurance coverage whatsoever (no Medicaid/ Medicare, etc.) °-- The pt. MUST have a primary care doctor that directs their care regularly and follows them in the community °  °[facility]  (866) 331-1348   °[employer]  (888) 892-1162   ° °Agencies that provide inexpensive medical care: °Organization         Address  Phone   Notes  °[facility]  (336) 832-8035   °[facility]    (336) 832-7272   °[facility] [address] °[place], [place] [address] (336) 832-4777   °[facility] [address], °[place] (336) 271-4999   °[facility]    (336) 373-0678   °[facility]    (336) 272-1050   °[facility] ° [address], [place] Phone:  (336) 832-4444, Fax:  (336) 832-4440 Hours of Operation:  9 am - 6 pm, M-F.  Also accepts Medicaid/Medicare and self-pay.  °[facility] ° [address], [place] Phone: (336) [phone], Fax: (336) [phone]. Hours of Operation:  8:30 am - 5:30 pm, M-F.  Also accepts Medicaid and self-pay.  °[facility] [place] [address], [place] Phone: (336) 878-6027   °[facility] [address], [place], [place] (336)723-1848, Ext. 123 Mondays & Thursdays: 7-9 AM.  First 15 patients are seen on a first come, first serve basis. °  ° °Medicaid-accepting [place] Providers: ° °Organization         Address  Phone   Notes  °[facility] [address], [place] (336) 641-2100 Also accepts self-pay patients.  °[facility] [address], [place] ° (336) 856-9996   °[facility] [address], [place]  (336) 288-8857   °[facility] [address] [place] [address], [place] (336) 299-7000   °Veita Bland [address], [place]  ° (336) 373-1557 Only accepts [place] Access Medicaid patients after they have their name applied to their card.  ° °Self-Pay (no insurance) in [place]: ° °Organization         Address  Phone   Notes  °Sickle Cell Patients, [facility] [address], [place] (336) 832-1970   °[facility] [address], [place] (336) 832-4400   °[facility] ° [address], [place] (336) 992-4800   °[facility]/Dr. Osei-Bonsu ° [address] [place] [address], [place] or [address], [place] (336) 841-8500 Phone number for both [place] and [place] locations is the same.  °[facility] [address], [place] (336) 299-0000   °[facility] [address] [place] [address], [place] or [address] (336) 852-7530 °(336) 878-2260   °[facility] [address], [place] (336) 350-1642, phone; (336) 294-5005, fax Sees patients 1st and 3rd Saturday of every month.  Must not qualify for public or private insurance (i.e. Medicaid, Medicare, [place] Health Choice, Veterans' Benefits) • Household income should be no more than 200% of the poverty level •The clinic cannot treat you if you are pregnant or think you are pregnant • Sexually transmitted diseases are not treated at the clinic.  ° ° °Dental Care: °Organization         Address  Phone  Notes  °[place] [facility] [address], [place] (336) 641-6152 Accepts children up to age 21 who are enrolled in Medicaid or [place] Health Choice; pregnant women with a Medicaid card; and children who have applied for Medicaid or [place] Health Choice, but were declined, whose parents can pay a reduced fee at time of service.  °[place]  [facility] [place]  [address], [place] (336) 641-7733 Accepts children up to age 21 who are enrolled in Medicaid or [place] Health Choice; pregnant women with a Medicaid card; and children who have applied for Medicaid or [place] Health Choice, but were declined, whose parents can pay a reduced fee at time of service.  °[facility] ° [address],  (336) 641-4533 Patients are seen by appointment only. Walk-ins are not accepted. [facility] will see patients 18 years of age and older. °Monday - Tuesday (8am-5pm) °Most Wednesdays (8:30-5pm) °$30 per visit, cash only  °[facility] ° [address], [place] (336) 641-4533 Patients are seen by appointment only. Walk-ins are not accepted. [facility] will see patients 18 years of age and older. °One   Wednesday Evening (Monthly: Volunteer Based).  $30 per visit, cash only  °[facility]  (919) 537-3737 for adults; Children under age 4, call Graduate Pediatric Dentistry at ([phone]) [phone]. Children aged 4-14, please call ([phone]) [phone] to request a pediatric application. ° Dental services are provided in all areas of dental care including fillings, crowns and bridges, complete and partial dentures, implants, gum treatment, root canals, and extractions. Preventive care is also provided. Treatment is provided to both adults and children. °Patients are selected via a lottery and there is often a waiting list. °  °[facility] [address], °[place] ° (336) 763-8833 [email or web address] °  °[facility] [address], [place], [place] (336)723-1848, Ext. 123 Second and Fourth Thursday of each month, opens at 6:30 AM; Clinic ends at 9 AM.  Patients are seen on a first-come first-served basis, and a limited number are seen during each clinic.  ° °[facility] ° [address], [place], [place] (336) 723-7904    Eligibility Requirements °You must have lived in [place], or [place] counties for at least the last three months. °  You cannot be eligible for state or federal sponsored healthcare insurance, including Veterans Administration, Medicaid, or Medicare. °  You generally cannot be eligible for healthcare insurance through your employer.  °  How to apply: °Eligibility screenings are held every Tuesday and Wednesday afternoon from 1:00 pm until 4:00 pm. You do not need an appointment for the interview!  °[facility] [address], [place], [place] 336-631-2330   °[facility]  336-342-8273   °[facility]  336-703-3100   °[facility]  336-570-6415   ° °Behavioral Health Resources in the Community: °Intensive Outpatient Programs °Organization         Address  Phone  Notes  °[facility] [address], [place], [place] 336-878-6098   °[facility] [address], [place], [place] 336-832-9800   °[facility] [address], [place], [place] ° 336-882-2125   °[facility] [address],  °[place], [place] 1-800-853-5163 or 336-641-4981   °Substance Abuse Resources °Organization         Address  Phone  Notes  °[facility]  336-882-2125   °[facility]  336-784-9470   °[facility]  336-285-9073   °[facility]  336-845-3988   °Residential & Outpatient Substance Abuse Program  1-800-659-3381   °Psychological Services °Organization         Address  Phone  Notes  °[facility]  336- 832-9600   °[facility]  336- 378-7881   °[facility] [address], [place] 1-800-853-5163 or 336-641-4981   ° °Mobile Crisis Teams °Organization         Address  Phone  Notes  °[facility], Mobile Crisis Care Unit  1-877-626-1772   °Assertive °[facility] ° [address]  [place], [place] 336-834-9664   °Sharon DeEsch [address] °[place] 336-554-5454   ° °Self-Help/Support Groups °Organization         Address  Phone             Notes  °[facility] [place] [facility] - variety of support groups  336- 373-1402 Call for more information  °Narcotics Anonymous (NA), [facility] [address], °[place]  2 meetings at this location  ° °  Residential Treatment Programs Organization         Address  Phone  Notes  [facility] [address]    [place]  1-(917)572-8008   [facility]  [address] [place] [address], [place], [place]   [place], [place] 252-757-5059 Admissions: 8am-3pm M-F  [facility] [place] [address]    [place], [place] 300-923-3007   [facility] [address] [place], [place], [place]   [facility] [address],  [place], [place]   [facility] Intensive Outpatient [address], [place], [place]   [facility] ([place] [address]  [place], [place] 1-9042991529 or 775-315-4531   [facility] [address], [place], [place] Accepts Medicaid  [facility] [address],  [place] 1-607-756-2315 Substance Abuse/Addiction Treatment   [facility]         Address  Phone  Notes  [facility]  (779)068-7839   Domenic Schwab, PhD [address]a [place], [place]   670-329-8339 or 662-048-6192   [place], [place] [phone]   [facility] [address] [place], [place] 216-430-4320 Insurance/Medicaid/sponsorship through [facility] [address]                                    [place], [place] 860-160-9504 [place] [address], [place] (380)[phone]    Dr. Adele Schilder  904-753-6018   [facility] Dept. 1) [address] [place] 2) [place] 3)  [place] [address], [place] 702-337-1299 [phone]  [phone]   [place] 361-586-9712 or 9081006166 (After Hours)      Eat a bland diet, avoiding greasy, fatty, fried foods, as well as spicy and acidic foods or beverages.  Avoid eating within the hour or 2 before going to bed or laying down.  Also avoid teas, colas, coffee, chocolate, pepermint and spearment.  Take over the counter pepcid, one tablet by mouth twice a day, for the next 2 to 3 weeks.  May also take over the counter maalox/mylanta, as directed on packaging, as needed for discomfort.  Take the prescription as directed.  Call your regular medical doctor and your GI doctor tomorrow to schedule a follow up appointment this week.  Return to the Emergency Department immediately if worsening.

## 2014-11-26 NOTE — ED Notes (Signed)
MD at bedside. 

## 2014-11-26 NOTE — ED Notes (Signed)
Having chest pain since yesterday evening.  Rates pain 8/10.  Radiating to left arm.  Left arm with numbness.

## 2014-11-26 NOTE — ED Provider Notes (Signed)
CSN: [id number]     Arrival date & time 11/26/14  1434 History   First MD Initiated Contact with Patient 11/26/14 1510     Chief Complaint  Patient presents with  . Chest Pain    since last night  . Numbness    left arm for several months  . Dizziness    today      HPI Pt was seen at 1515. Per pt, c/o gradual onset and persistence of constant left sided chest "pain" since last evening. Describes the discomfort as "nagging" and "tightness." Pt also c/o feeling "dizzy today," as well as having upper left arm "numbness" for the past several months. LUE "numbness" improves with movement of his left arm.  Pt also c/o continued nausea and "my reflux" since his EGD on 11/22/14. Denies palpitations, no SOB/cough, no abd pain, no vomiting/diarrhea, no black or blood in stools, no fevers, no rash, no arm injury, no focal motor weakness, no headache, no visual changes, no facial droop, no slurred speech.     Past Medical History  Diagnosis Date  . Hypertension   . Hiatal hernia   . GERD (gastroesophageal reflux disease)     "Associated nausea, regurgitation, and documented weight loss noted. Significant anxiety overlay" per GI ofc note  . Anxiety   . Headache    Past Surgical History  Procedure Laterality Date  . Appendectomy    . Colonoscopy with propofol N/A 11/22/2014    Procedure: COLONOSCOPY WITH PROPOFOL;  Surgeon: Daneil Dolin, MD;  Location: [facility];  Service: Endoscopy;  Laterality: N/A;  cecum time in 1109  time out 1117  total time 8 minutes  . Esophagogastroduodenoscopy (egd) with propofol N/A 11/22/2014    Procedure: ESOPHAGOGASTRODUODENOSCOPY (EGD) WITH PROPOFOL;  Surgeon: Daneil Dolin, MD;  Location: [facility];  Service: Endoscopy;  Laterality: N/A;  procedure 1  . Esophageal biopsy N/A 11/22/2014    Procedure: BIOPSY;  Surgeon: Daneil Dolin, MD;  Location: [facility];  Service: Endoscopy;  Laterality: N/A;  gastric,   . Polypectomy N/A 11/22/2014    Procedure: POLYPECTOMY;  Surgeon:  Daneil Dolin, MD;  Location: [facility];  Service: Endoscopy;  Laterality: N/A;  ascending colon    Family History  Problem Relation Age of Onset  . Hypertension Mother   . Hypertension Father   . Colon cancer Neg Hx    Social History  Substance Use Topics  . Smoking status: Former Smoker -- 1.00 packs/day for 15 years    Types: Cigarettes    Quit date: 11/15/2002  . Smokeless tobacco: Never Used     Comment: Quit x 12 years  . Alcohol Use: No    Review of Systems ROS: Statement: All systems negative except as marked or noted in the HPI; Constitutional: Negative for fever and chills. ; ; Eyes: Negative for eye pain, redness and discharge. ; ; ENMT: Negative for ear pain, hoarseness, nasal congestion, sinus pressure and sore throat. ; ; Cardiovascular: +CP. Negative for palpitations, diaphoresis, dyspnea and peripheral edema. ; ; Respiratory: Negative for cough, wheezing and stridor. ; ; Gastrointestinal: +nausea. Negative for vomiting, diarrhea, abdominal pain, blood in stool, hematemesis, jaundice and rectal bleeding. . ; ; Genitourinary: Negative for dysuria, flank pain and hematuria. ; ; Musculoskeletal: Negative for back pain and neck pain. Negative for swelling and trauma.; ; Skin: Negative for pruritus, rash, abrasions, blisters, bruising and skin lesion.; ; Neuro: +paresthesias LUE. Negative for headache, lightheadedness and neck stiffness. Negative for weakness,  altered level of consciousness , altered mental status, extremity weakness, involuntary movement, seizure and syncope.     Allergies  Hydrocodone  Home Medications   Prior to Admission medications   Medication Sig Start Date End Date Taking? Authorizing Provider  acetaminophen (TYLENOL) 500 MG tablet Take 500 mg by mouth every 6 (six) hours as needed for mild pain.    Yes Historical Provider, MD  bismuth subsalicylate (PEPTO BISMOL) 262 MG chewable tablet Chew 524 mg by mouth as needed.   Yes Historical Provider, MD   docusate sodium (COLACE) 100 MG capsule Take 100 mg by mouth daily as needed for mild constipation.   Yes Historical Provider, MD  lansoprazole (PREVACID) 30 MG capsule Take 1 capsule (30 mg total) by mouth daily at 12 noon. 11/07/14  Yes Carlis Stable, NP  lisinopril (PRINIVIL,ZESTRIL) 10 MG tablet Take 10 mg by mouth daily. 08/07/14  Yes Historical Provider, MD  magnesium citrate SOLN Take 1 Bottle by mouth as needed for severe constipation.   Yes Historical Provider, MD  Multiple Vitamin (ONE-A-DAY MENS PO) Take 1 tablet by mouth daily.   Yes Historical Provider, MD  Potassium (POTASSIMIN PO) Take 1 tablet by mouth daily.   Yes Historical Provider, MD  Simethicone (MYLANTA GAS PO) Take 30 mLs by mouth as needed.   Yes Historical Provider, MD  sucralfate (CARAFATE) 1 G tablet Take 1 tablet (1 g total) by mouth 4 (four) times daily -  with meals and at bedtime. 08/09/14  Yes Noemi Chapel, MD  traMADol (ULTRAM) 50 MG tablet Take 50 mg by mouth every 6 (six) hours as needed for moderate pain.    Yes Historical Provider, MD  zolpidem (AMBIEN) 10 MG tablet Take 1 tablet at bedtime as needed sleep. 08/22/14  Yes Historical Provider, MD   BP 125/97 mmHg  Pulse 104  Temp(Src) 98 F (36.7 C) (Oral)  Resp 10  Ht 5\' 9"  (1.753 m)  Wt 140 lb (63.504 kg)  BMI 20.67 kg/m2  SpO2 97%   16:48:09 Orthostatic Vital Signs SH  Orthostatic Lying  - BP- Lying: 122/90 mmHg ; Pulse- Lying: 101  Orthostatic Sitting - BP- Sitting: 125/97 mmHg ; Pulse- Sitting: 103  Orthostatic Standing at 0 minutes - BP- Standing at 0 minutes: 111/87 mmHg ; Pulse- Standing at 0 minutes: 121      Physical Exam  1520: Physical examination:  Nursing notes reviewed; Vital signs and O2 SAT reviewed;  Constitutional: Well developed, Well nourished, Well hydrated, In no acute distress; Head:  Normocephalic, atraumatic; Eyes: EOMI, PERRL, No scleral icterus; ENMT: Mouth and pharynx normal, Mucous membranes moist; Neck: Supple, Full range of  motion, No lymphadenopathy; Cardiovascular: Tachycardic rate and rhythm, No murmur, rub, or gallop; Respiratory: Breath sounds clear & equal bilaterally, No rales, rhonchi, wheezes.  Speaking full sentences with ease, Normal respiratory effort/excursion; Chest: Nontender, No soft tissue crepitus, no deformity. Movement normal; Abdomen: Soft, Nontender, Nondistended, Normal bowel sounds; Genitourinary: No CVA tenderness; Spine:  No midline CS, TS, LS tenderness.;; Extremities: Pulses normal,  Left shoulder w/FROM.  NT to palp entire joint, AC joint, clavicle NT, scapula NT, proximal humerus NT, biceps tendon NT over bicipital groove.  Motor strength at shoulder normal.  Sensation intact over deltoid region, distal NMS intact with left hand having intact and equal sensation and strength in the distribution of the median, radial, and ulnar nerve function compared to opposite side.  Strong radial pulse.  +FROM left elbow with intact motor strength biceps and  triceps muscles to resistance. NT left shoulder/elbow/wrist/hand. No deformity, no edema, no rash, no ecchymosis. No calf edema or asymmetry.; Neuro: AA&Ox3, Major CN grossly intact.  Speech clear. No gross focal motor or sensory deficits in extremities.; Skin: Color normal, Warm, Dry.; Psych:  Anxious.    ED Course  Procedures (including critical care time) Labs Review   Imaging Review  I have personally reviewed and evaluated these images and lab results as part of my medical decision-making.   EKG Interpretation   Date/Time:  Monday November 26 2014 14:43:35 EDT Ventricular Rate:  97 PR Interval:  201 QRS Duration: 77 QT Interval:  338 QTC Calculation: 429 R Axis:   81 Text Interpretation:  Sinus rhythm Borderline prolonged PR interval  Baseline wander When compared with ECG of 08/22/2014 QT has shortened  Otherwise no significant change Confirmed by [facility]  MD, Nunzio Cory [phone])  on 11/26/2014 3:38:44 PM      MDM  MDM Reviewed:  previous chart, nursing note and vitals Reviewed previous: labs and ECG Interpretation: labs, ECG, x-ray and CT scan     Results for orders placed or performed during the hospital encounter of 11/26/14  CBC with Differential  Result Value Ref Range   WBC 5.7 4.0 - 10.5 K/uL   RBC 5.04 4.22 - 5.81 MIL/uL   Hemoglobin 15.7 13.0 - 17.0 g/dL   HCT 43.7 39.0 - 52.0 %   MCV 86.7 78.0 - 100.0 fL   MCH 31.2 26.0 - 34.0 pg   MCHC 35.9 30.0 - 36.0 g/dL   RDW 12.2 11.5 - 15.5 %   Platelets 237 150 - 400 K/uL   Neutrophils Relative % 61 43 - 77 %   Neutro Abs 3.4 1.7 - 7.7 K/uL   Lymphocytes Relative 30 12 - 46 %   Lymphs Abs 1.7 0.7 - 4.0 K/uL   Monocytes Relative 9 3 - 12 %   Monocytes Absolute 0.5 0.1 - 1.0 K/uL   Eosinophils Relative 0 0 - 5 %   Eosinophils Absolute 0.0 0.0 - 0.7 K/uL   Basophils Relative 0 0 - 1 %   Basophils Absolute 0.0 0.0 - 0.1 K/uL  Comprehensive metabolic panel  Result Value Ref Range   Sodium 135 135 - 145 mmol/L   Potassium 4.3 3.5 - 5.1 mmol/L   Chloride 99 (L) 101 - 111 mmol/L   CO2 30 22 - 32 mmol/L   Glucose, Bld 112 (H) 65 - 99 mg/dL   BUN 15 6 - 20 mg/dL   Creatinine, Ser 1.25 (H) 0.61 - 1.24 mg/dL   Calcium 9.2 8.9 - 10.3 mg/dL   Total Protein 7.6 6.5 - 8.1 g/dL   Albumin 4.2 3.5 - 5.0 g/dL   AST 37 15 - 41 U/L   ALT 56 17 - 63 U/L   Alkaline Phosphatase 74 38 - 126 U/L   Total Bilirubin 0.6 0.3 - 1.2 mg/dL   GFR calc non Af Amer >60 >60 mL/min   GFR calc Af Amer >60 >60 mL/min   Anion gap 6 5 - 15  Troponin I  Result Value Ref Range   Troponin I <0.03 <0.031 ng/mL   Dg Chest 1 View 11/26/2014   CLINICAL DATA:  Chest pain. Repeat with nipple markers for left nodular opacity  EXAM: CHEST  1 VIEW  COMPARISON:  11/26/2014  FINDINGS: The left nipple marker corresponds to the nodular opacity identified on chest radiograph earlier today.  There is no evidence of focal airspace  disease, pulmonary edema, suspicious pulmonary nodule/mass, pleural  effusion, or pneumothorax. No acute bony abnormalities are identified.  IMPRESSION: No active disease.  Nodular opacity on prior chest radiograph represents nipple.   Electronically Signed   By: Margarette Canada M.D.   On: 11/26/2014 15:52   Dg Chest 2 View 11/26/2014   CLINICAL DATA:  Chest pain onset yesterday evening.  Dizziness  EXAM: CHEST  2 VIEW  COMPARISON:  Overlapping images from 08/22/2014  FINDINGS: A 7 mm nodular density projects over the left mid lung on the frontal view.  Lungs appear otherwise clear. Cardiac and mediastinal margins appear normal. No pleural effusion.  IMPRESSION: 1. 7 mm nodular density projects over the left mid lung. This is probably a nipple shadow but could be a neoplastic pulmonary nodule. I recommend either nipple marker view of the chest, or chest CT, for further characterization.   Electronically Signed   By: Van Clines M.D.   On: 11/26/2014 15:16   Ct Head Wo Contrast 11/26/2014   CLINICAL DATA:  LEFT arm numbness which began on 11/22/2014 after an upper endoscopy and colonoscopy  EXAM: CT HEAD WITHOUT CONTRAST  CT CERVICAL SPINE WITHOUT CONTRAST  TECHNIQUE: Multidetector CT imaging of the head and cervical spine was performed following the standard protocol without intravenous contrast. Multiplanar CT image reconstructions of the cervical spine were also generated.  COMPARISON:  None.  FINDINGS: CT HEAD FINDINGS  No acute intracranial hemorrhage. No focal mass lesion. No CT evidence of acute infarction. No midline shift or mass effect. No hydrocephalus. Basilar cisterns are patent.  CT CERVICAL SPINE FINDINGS  No prevertebral soft tissue swelling. Normal alignment of cervical vertebral bodies. No loss of vertebral body height. Normal facet articulation. Normal craniocervical junction.  No evidence epidural or paraspinal hematoma.  IMPRESSION: 1. Normal head CT. 2. No cervical spine fracture or acute abnormality. No significant arthropathy.  La   Electronically Signed    By: Suzy Bouchard M.D.   On: 11/26/2014 15:56   Ct Cervical Spine Wo Contrast 11/26/2014   CLINICAL DATA:  LEFT arm numbness which began on 11/22/2014 after an upper endoscopy and colonoscopy  EXAM: CT HEAD WITHOUT CONTRAST  CT CERVICAL SPINE WITHOUT CONTRAST  TECHNIQUE: Multidetector CT imaging of the head and cervical spine was performed following the standard protocol without intravenous contrast. Multiplanar CT image reconstructions of the cervical spine were also generated.  COMPARISON:  None.  FINDINGS: CT HEAD FINDINGS  No acute intracranial hemorrhage. No focal mass lesion. No CT evidence of acute infarction. No midline shift or mass effect. No hydrocephalus. Basilar cisterns are patent.  CT CERVICAL SPINE FINDINGS  No prevertebral soft tissue swelling. Normal alignment of cervical vertebral bodies. No loss of vertebral body height. Normal facet articulation. Normal craniocervical junction.  No evidence epidural or paraspinal hematoma.  IMPRESSION: 1. Normal head CT. 2. No cervical spine fracture or acute abnormality. No significant arthropathy.  La   Electronically Signed   By: Suzy Bouchard M.D.   On: 11/26/2014 15:56    1715:  Pt with multiple complaints. Doubt PE as cause for symptoms with low risk [place].  Doubt ACS as cause for symptoms with normal troponin and unchanged EKG from previous after 2 days of constant symptoms. Pt is not orthostatic on VS. Workup is reassuring. Pt is very anxious. VS today are per pt's baseline, per EPIC chart review. Pt has tol PO well without N/V. Abd remains benign. States he feels better and wants to go  home now. Will rx phenergan prn nausea, f/u GI MD. Dx and testing d/w pt and family.  Questions answered.  Verb understanding, agreeable to d/c home with outpt f/u.   Francine Graven, DO 11/29/14 2211

## 2014-11-28 ENCOUNTER — Encounter: Payer: Self-pay | Admitting: Internal Medicine

## 2014-11-28 ENCOUNTER — Other Ambulatory Visit: Payer: Self-pay

## 2014-11-28 ENCOUNTER — Encounter: Payer: Self-pay | Admitting: Gastroenterology

## 2014-11-28 ENCOUNTER — Ambulatory Visit (INDEPENDENT_AMBULATORY_CARE_PROVIDER_SITE_OTHER): Payer: BLUE CROSS/BLUE SHIELD | Admitting: Gastroenterology

## 2014-11-28 VITALS — BP 135/87 | HR 126 | Temp 97.6°F | Ht 69.0 in | Wt 140.4 lb

## 2014-11-28 DIAGNOSIS — K219 Gastro-esophageal reflux disease without esophagitis: Secondary | ICD-10-CM

## 2014-11-28 DIAGNOSIS — R079 Chest pain, unspecified: Secondary | ICD-10-CM

## 2014-11-28 NOTE — Patient Instructions (Signed)
Continue Dexilant daily.   You may take Phenergan as needed for nausea. Take 1/2 tablet as this can cause drowsiness.   I have referred you to the cardiologist due to chest pain.  Make sure you see your primary care doctor about anxiety.   We will see you back in 6 months.

## 2014-11-28 NOTE — Progress Notes (Signed)
cc'ed to pcp °

## 2014-11-28 NOTE — Assessment & Plan Note (Signed)
GERD and dyspepsia symptoms much improved. Recent EGD and colonoscopy on file. Continue Dexilant. Chest discomfort with arm numbness noted. Troponin normal. Doing well from a GI standpoint. Doubt cardiac etiology, as he is highly anxious and may be dealing with panic attacks. However, will refer to cardiology for an initial consultation to be complete. Return in 6 months. Next colonoscopy likely in 5 years.

## 2014-11-28 NOTE — Progress Notes (Signed)
Referring Provider: Ginger Organ Primary Care Physician:  Collene Mares, PA-C  Primary GI: Dr. Gala Romney   Chief Complaint  Patient presents with  . Follow-up    HPI:   Daniel Atkinson is a 49 y.o. male presenting today with a history of GERD, dyspepsia, and recent EGD/colonoscopy. EGD with mild chronic gastritis, tubular adenoma on colonoscopy. Dexilant prescribed. Recently seen in the ED 2 days ago with chest pain, dizziness. Felt numbness in left arm. Pain occurred out of the blue.   States his abdominal pain is resolved. Feels anxious, feels it is stress-related. Trying to get an appt to see PCP regarding anxiety. Mowed the yard the day after the procedure, thinks he may have pulled something. Good appetite. No N/V. No abdominal pain.   Past Medical History  Diagnosis Date  . Hypertension   . Hiatal hernia   . GERD (gastroesophageal reflux disease)     "Associated nausea, regurgitation, and documented weight loss noted. Significant anxiety overlay" per GI ofc note  . Anxiety   . Headache     Past Surgical History  Procedure Laterality Date  . Appendectomy    . Colonoscopy with propofol N/A 11/22/2014    RMR: single colonic polyp removed, tubular adenoma  . Esophagogastroduodenoscopy (egd) with propofol N/A 11/22/2014    Dr. Rourk:abnormal  gastric mucosa of doubtful clinical significance status post biopsy. mild chronic gastritis.   . Esophageal biopsy N/A 11/22/2014    Procedure: BIOPSY;  Surgeon: Daneil Dolin, MD;  Location: [facility];  Service: Endoscopy;  Laterality: N/A;  gastric,   . Polypectomy N/A 11/22/2014    Procedure: POLYPECTOMY;  Surgeon: Daneil Dolin, MD;  Location: [facility];  Service: Endoscopy;  Laterality: N/A;  ascending colon     Current Outpatient Prescriptions  Medication Sig Dispense Refill  . acetaminophen (TYLENOL) 500 MG tablet Take 500 mg by mouth every 6 (six) hours as needed for mild pain.     Marland Kitchen bismuth subsalicylate (PEPTO BISMOL) 262 MG  chewable tablet Chew 524 mg by mouth as needed.    Marland Kitchen dexlansoprazole (DEXILANT) 60 MG capsule Take 60 mg by mouth daily.    Marland Kitchen docusate sodium (COLACE) 100 MG capsule Take 100 mg by mouth daily as needed for mild constipation.    Marland Kitchen lisinopril (PRINIVIL,ZESTRIL) 10 MG tablet Take 10 mg by mouth daily.  5  . magnesium citrate SOLN Take 1 Bottle by mouth as needed for severe constipation.    . Multiple Vitamin (ONE-A-DAY MENS PO) Take 1 tablet by mouth daily.    . Potassium (POTASSIMIN PO) Take 1 tablet by mouth daily.    . promethazine (PHENERGAN) 25 MG tablet Take 1 tablet (25 mg total) by mouth every 6 (six) hours as needed for nausea or vomiting. 8 tablet 0  . Simethicone (MYLANTA GAS PO) Take 30 mLs by mouth as needed.    . sucralfate (CARAFATE) 1 G tablet Take 1 tablet (1 g total) by mouth 4 (four) times daily -  with meals and at bedtime. 30 tablet 0  . traMADol (ULTRAM) 50 MG tablet Take 50 mg by mouth every 6 (six) hours as needed for moderate pain.     Marland Kitchen zolpidem (AMBIEN) 10 MG tablet Take 1 tablet at bedtime as needed sleep.  1   No current facility-administered medications for this visit.    Allergies as of 11/28/2014 - Review Complete 11/28/2014  Allergen Reaction Noted  . Hydrocodone Nausea And Vomiting 08/09/2014  Family History  Problem Relation Age of Onset  . Hypertension Mother   . Hypertension Father   . Colon cancer Neg Hx     Social History   Social History  . Marital Status: Married    Spouse Name: N/A  . Number of Children: N/A  . Years of Education: N/A   Social History Main Topics  . Smoking status: Former Smoker -- 1.00 packs/day for 15 years    Types: Cigarettes    Quit date: 11/15/2002  . Smokeless tobacco: Never Used     Comment: Quit x 12 years  . Alcohol Use: No  . Drug Use: No  . Sexual Activity: Yes    Birth Control/ Protection: None   Other Topics Concern  . None   Social History Narrative    Review of Systems: Negative unless  mentioned in HPI  Physical Exam: BP 135/87 mmHg  Pulse 126  Temp(Src) 97.6 F (36.4 C) (Oral)  Ht 5\' 9"  (1.753 m)  Wt 140 lb 6.4 oz (63.685 kg)  BMI 20.72 kg/m2 General:   Alert and oriented. No distress noted. Pleasant and cooperative.  Head:  Normocephalic and atraumatic. Eyes:  Conjuctiva clear without scleral icterus. Abdomen:  +BS, soft, non-tender and non-distended. No rebound or guarding. No HSM or masses noted. Msk:  Symmetrical without gross deformities. Normal posture. Extremities:  Without edema. Neurologic:  Alert and  oriented x4;  grossly normal neurologically. Psych:  Alert and cooperative. Normal mood and affect.  Lab Results  Component Value Date   WBC 5.7 11/26/2014   HGB 15.7 11/26/2014   HCT 43.7 11/26/2014   MCV 86.7 11/26/2014   PLT 237 11/26/2014   Lab Results  Component Value Date   ALT 56 11/26/2014   AST 37 11/26/2014   ALKPHOS 74 11/26/2014   BILITOT 0.6 11/26/2014   Lab Results  Component Value Date   CREATININE 1.25* 11/26/2014   BUN 15 11/26/2014   NA 135 11/26/2014   K 4.3 11/26/2014   CL 99* 11/26/2014   CO2 30 11/26/2014

## 2014-11-29 ENCOUNTER — Encounter: Payer: Self-pay | Admitting: Internal Medicine

## 2014-11-30 ENCOUNTER — Telehealth: Payer: Self-pay

## 2014-11-30 NOTE — Telephone Encounter (Signed)
Letter mailed to the pt. 

## 2014-11-30 NOTE — Telephone Encounter (Signed)
Per RMR- Send letter to patient.  Send copy of letter with path to referring provider and PCP.   Would offer a follow-up appointment in about 3 months to review symptoms if not already scheduled

## 2014-11-30 NOTE — Telephone Encounter (Signed)
PATIENT HAS FU OV

## 2014-12-24 ENCOUNTER — Ambulatory Visit (INDEPENDENT_AMBULATORY_CARE_PROVIDER_SITE_OTHER): Payer: BLUE CROSS/BLUE SHIELD | Admitting: Cardiology

## 2014-12-24 ENCOUNTER — Encounter: Payer: Self-pay | Admitting: Cardiology

## 2014-12-24 VITALS — BP 124/78 | HR 100 | Ht 69.0 in | Wt 140.0 lb

## 2014-12-24 DIAGNOSIS — K219 Gastro-esophageal reflux disease without esophagitis: Secondary | ICD-10-CM | POA: Diagnosis not present

## 2014-12-24 DIAGNOSIS — R072 Precordial pain: Secondary | ICD-10-CM

## 2014-12-24 DIAGNOSIS — Z8249 Family history of ischemic heart disease and other diseases of the circulatory system: Secondary | ICD-10-CM

## 2014-12-24 DIAGNOSIS — I10 Essential (primary) hypertension: Secondary | ICD-10-CM | POA: Diagnosis not present

## 2014-12-24 NOTE — Patient Instructions (Signed)
Your physician recommends that you schedule a follow-up appointment in:  To be determined after stress echo   Your physician recommends that you continue on your current medications as directed. Please refer to the Current Medication list given to you today.   Your physician has requested that you have a stress echocardiogram. For further information please visit [email or web address]. Please follow instruction sheet as given.      Thank you for choosing [facility] !

## 2014-12-24 NOTE — Progress Notes (Signed)
Cardiology Office Note  Date: 12/24/2014   ID: Daniel Atkinson, DOB 1965-08-03, MRN [id number]  PCP: Collene Mares, PA-C  Referring provider: Laban Emperor, NP Consulting Cardiologist: Rozann Lesches, MD   Chief Complaint  Patient presents with  . Chest discomfort    History of Present Illness: Daniel Atkinson is a 49 y.o. male referred for cardiology consultation by Ms. Sams NP. Recent GI office follow-up note reviewed, patient is followed with recurring GERD symptoms, recent EGD and colonoscopy results noted. He is managed with Dexilant and Carafate. He reports this combination has been effective recently.  He tells me that he began to experience a feeling of chest tightness and vague numbness in his left arm a day or so after undergoing endoscopy. He had also mowed his yard that day and had been active outdoors. He was seen in the ER, troponin I levels were negative, and his ECG showed nonspecific ST changes as outlined below. He tells me that these symptoms have completely resolved. He does not have any regular exertional chest discomfort or unusual shortness of breath with typical activities.  He reports that his father died at age 65 with a myocardial infarction. Personal history also includes hypertension. He has not undergone any prior ischemic cardiac testing.   Past Medical History  Diagnosis Date  . Essential hypertension   . Hiatal hernia   . GERD (gastroesophageal reflux disease)   . Anxiety   . Headache     Past Surgical History  Procedure Laterality Date  . Appendectomy    . Colonoscopy with propofol N/A 11/22/2014    RMR: single colonic polyp removed, tubular adenoma  . Esophagogastroduodenoscopy (egd) with propofol N/A 11/22/2014    Dr. Rourk:abnormal  gastric mucosa of doubtful clinical significance status post biopsy. mild chronic gastritis.   . Esophageal biopsy N/A 11/22/2014    Procedure: BIOPSY;  Surgeon: Daneil Dolin, MD;  Location: [facility];  Service:  Endoscopy;  Laterality: N/A;  gastric,   . Polypectomy N/A 11/22/2014    Procedure: POLYPECTOMY;  Surgeon: Daneil Dolin, MD;  Location: [facility];  Service: Endoscopy;  Laterality: N/A;  ascending colon     Current Outpatient Prescriptions  Medication Sig Dispense Refill  . acetaminophen (TYLENOL) 500 MG tablet Take 500 mg by mouth every 6 (six) hours as needed for mild pain.     Marland Kitchen amitriptyline (ELAVIL) 10 MG tablet Take 10 mg by mouth at bedtime.    . bismuth subsalicylate (PEPTO BISMOL) 262 MG chewable tablet Chew 524 mg by mouth as needed.    . docusate sodium (COLACE) 100 MG capsule Take 100 mg by mouth daily as needed for mild constipation.    . lansoprazole (PREVACID) 30 MG capsule Take 30 mg by mouth daily at 12 noon.    Marland Kitchen lisinopril (PRINIVIL,ZESTRIL) 10 MG tablet Take 10 mg by mouth daily.  5  . magnesium citrate SOLN Take 1 Bottle by mouth as needed for severe constipation.    . Multiple Vitamin (ONE-A-DAY MENS PO) Take 1 tablet by mouth daily.    . Potassium (POTASSIMIN PO) Take 1 tablet by mouth daily.    . Simethicone (MYLANTA GAS PO) Take 30 mLs by mouth as needed.    . sucralfate (CARAFATE) 1 G tablet Take 1 tablet (1 g total) by mouth 4 (four) times daily -  with meals and at bedtime. 30 tablet 0  . traMADol (ULTRAM) 50 MG tablet Take 50 mg by mouth every 6 (six) hours as  needed for moderate pain.     Marland Kitchen zolpidem (AMBIEN) 10 MG tablet Take 1 tablet at bedtime as needed sleep.  1   No current facility-administered medications for this visit.    Allergies:  Hydrocodone   Social History: The patient  reports that he quit smoking about 12 years ago. His smoking use included Cigarettes. He has a 15 pack-year smoking history. He has never used smokeless tobacco. He reports that he does not drink alcohol or use illicit drugs.   Family History: The patient's family history includes CAD in his father; Hypertension in his father and mother. There is no history of Colon cancer.   ROS:   Please see the history of present illness. Otherwise, complete review of systems is positive for anxiety.  All other systems are reviewed and negative.   Physical Exam: VS:  BP 124/78 mmHg  Pulse 100  Ht 5\' 9"  (1.753 m)  Wt 140 lb (63.504 kg)  BMI 20.67 kg/m2  SpO2 98%, BMI Body mass index is 20.67 kg/(m^2).  Wt Readings from Last 3 Encounters:  12/24/14 140 lb (63.504 kg)  11/28/14 140 lb 6.4 oz (63.685 kg)  11/26/14 140 lb (63.504 kg)     General: Patient appears comfortable at rest. HEENT: Conjunctiva and lids normal, oropharynx clear. Neck: Supple, no elevated JVP or carotid bruits, no thyromegaly. Lungs: Clear to auscultation, nonlabored breathing at rest. Cardiac: Regular rate and rhythm, no S3 or significant systolic murmur, no pericardial rub. Abdomen: Soft, nontender, bowel sounds present, no guarding or rebound. Extremities: No pitting edema, distal pulses 2+. Skin: Warm and dry. Musculoskeletal: No kyphosis. Neuropsychiatric: Alert and oriented x3, affect grossly appropriate.   ECG: Tracing from 11/26/2014 showed sinus rhythm with prolonged PR interval and nonspecific ST changes.  Recent Labwork: 11/26/2014: ALT 56; AST 37; BUN 15; Creatinine, Ser 1.25*; Hemoglobin 15.7; Platelets 237; Potassium 4.3; Sodium 135   Other Studies Reviewed Today:  Chest x-ray 11/26/2014: FINDINGS: The left nipple marker corresponds to the nodular opacity identified on chest radiograph earlier today.  There is no evidence of focal airspace disease, pulmonary edema, suspicious pulmonary nodule/mass, pleural effusion, or pneumothorax. No acute bony abnormalities are identified.  IMPRESSION: No active disease.  Nodular opacity on prior chest radiograph represents nipple.  ASSESSMENT AND PLAN:  1. Episode of precordial pain as outlined above, presently resolved. This occurred after endoscopy, also after an episode of exertion outdoors. He has a baseline history of hypertension, also  family history of premature CAD in his father. ECGs reviewed and nonspecific. Recent troponin I levels also negative during ER visit. Plan is to obtain an exercise echocardiogram for objective ischemic evaluation.  2. Essential hypertension, blood pressure control is good today.  3. GERD, symptomatically improving on current regimen and followed by Gastroenterology.  Current medicines were reviewed at length with the patient today.   Orders Placed This Encounter  Procedures  . Echo stress    Disposition: Call with results.   Signed, Satira Sark, MD, [facility] 12/24/2014 8:51 AM    [place] [facility] [address] [place], [place] [address] Phone: [phone]; Fax: [phone]

## 2014-12-28 ENCOUNTER — Encounter (HOSPITAL_COMMUNITY): Payer: Self-pay

## 2014-12-28 ENCOUNTER — Ambulatory Visit (HOSPITAL_COMMUNITY)
Admission: RE | Admit: 2014-12-28 | Discharge: 2014-12-28 | Disposition: A | Discharge status: Home / Self Care | Payer: BLUE CROSS/BLUE SHIELD | Source: Ambulatory Visit | Attending: Cardiology | Admitting: Cardiology

## 2014-12-28 DIAGNOSIS — R072 Precordial pain: Secondary | ICD-10-CM | POA: Diagnosis not present

## 2014-12-28 DIAGNOSIS — R079 Chest pain, unspecified: Secondary | ICD-10-CM | POA: Diagnosis present

## 2014-12-28 LAB — ECHOCARDIOGRAM STRESS TEST
CHL CUP MPHR: 169 {beats}/min
CSEPEDS: 39 s
CSEPEW: 10.1 METS
CSEPPHR: 179 {beats}/min
Exercise duration (min): 7 min
Percent HR: 105 %
RPE: 14
Rest HR: 112 {beats}/min

## 2015-01-21 ENCOUNTER — Telehealth: Payer: Self-pay | Admitting: Internal Medicine

## 2015-01-21 NOTE — Telephone Encounter (Signed)
Patient called to say that nothing helps his acid reflux accept the Dexilant and was asking if he could get more samples. Please advise and call [phone]

## 2015-01-21 NOTE — Telephone Encounter (Signed)
#  3 boxes of dexilant are at the front desk.  Tried to call pt- NA and no voicemail.   Can we send in rx?

## 2015-01-21 NOTE — Telephone Encounter (Signed)
PT PICKED UP SAMPLES

## 2015-01-22 MED ORDER — DEXLANSOPRAZOLE 60 MG PO CPDR: 60.0000 mg | DELAYED_RELEASE_CAPSULE | Freq: Every day | ORAL | Status: DC

## 2015-01-22 NOTE — Telephone Encounter (Signed)
Dexilant RX done.

## 2015-01-29 NOTE — Telephone Encounter (Signed)
Tried to do a PA for this. Received a fax today, it stated dexilant has already been denied this year and it will need an appeal letter sent in to possibly get it covered.

## 2015-01-30 ENCOUNTER — Ambulatory Visit: Payer: BLUE CROSS/BLUE SHIELD | Admitting: Nurse Practitioner

## 2015-02-01 NOTE — Telephone Encounter (Signed)
From what I can tell looking at his chart he has failed omeprazole, pantoprazole, and lansoprazole. Can we find out what he needs to fail and send in appeal letter? He was started on Dexilant after EGD due to continued symptoms and endoscopic findings.

## 2015-02-04 NOTE — Telephone Encounter (Signed)
Pt needs to try and fail generic aciphex. If he fails this one he will need an appeal letter.

## 2015-02-06 MED ORDER — RABEPRAZOLE SODIUM 20 MG PO TBEC: 20.0000 mg | DELAYED_RELEASE_TABLET | Freq: Every day | ORAL | Status: DC

## 2015-02-06 NOTE — Addendum Note (Signed)
Addended by: Gordy Levan, ERIC A on: 02/06/2015 10:06 PM   Modules accepted: Orders

## 2015-02-06 NOTE — Telephone Encounter (Signed)
Please communicate the information about needing to try and fail Aciphex. I have sent in an Rx to his pharmacy about Aciphex. Please give it 4-6 weeks and call with the results. If he fails this we can re-request Dexilant via appeal letter.

## 2015-02-07 NOTE — Telephone Encounter (Signed)
Pt is aware. He will try Aciphex and let us know how it works for him.

## 2015-02-07 NOTE — Telephone Encounter (Signed)
Tried to call pt- NA and no voicemail. 

## 2015-02-18 ENCOUNTER — Telehealth: Payer: Self-pay | Admitting: Internal Medicine

## 2015-02-18 NOTE — Telephone Encounter (Signed)
Medicine isn't helping him like the other medicine was. He said it was raveprazole DR 25 mg ??? Please call patient at [phone]

## 2015-02-21 NOTE — Telephone Encounter (Signed)
Tried to call- NA 

## 2015-02-22 NOTE — Telephone Encounter (Signed)
Spoke with the pt. The aciphex caused diarrhea, swelling and itching. He took it for several days and he has stopped taking it now. I will do another PA for dexilant but we may still need an appeal letter. I have given him #4 bottles of dexilant to help while doing appeal for dexilant.

## 2015-02-26 ENCOUNTER — Encounter: Payer: Self-pay | Admitting: Nurse Practitioner

## 2015-02-26 NOTE — Telephone Encounter (Signed)
Pt has tried and failed lansoprazole, omeprazole, pantoprazole and rabeprazole. Per [facility], it needs an appeal letter done.  Routing to [facility]. Please see prior phone note as well.

## 2015-02-26 NOTE — Telephone Encounter (Signed)
Letter completed in Epic and submitted for mailing. Please notify the patient, we will await response.

## 2015-02-27 NOTE — Telephone Encounter (Signed)
Appeal letter has been faxed to [facility].

## 2015-03-05 NOTE — Telephone Encounter (Signed)
Appeal was approved until 02/2098. I have faxed a copy to [facility] and the pt is aware.

## 2015-03-07 ENCOUNTER — Telehealth: Payer: Self-pay | Admitting: Internal Medicine

## 2015-03-07 NOTE — Telephone Encounter (Signed)
PATIENT CALLED STATING THAT HE WAS NOT CALLED ABOUT THE PRESCRIPTION HE WAS SUPPOSED TO GET FROM HIS INSURANCE COMPANY.   THEY TOLD HIM IT WAS ANOTHER NAME THAT HE WAS NOT FAMILIAR WITH.   SUPPOSED TO BE DEXILANT BUT IT WAS SOMETHING ELSE   PLEASE CALL HIM [phone]

## 2015-03-07 NOTE — Telephone Encounter (Signed)
I called the pharmacy and left them a voicemail with the patients correct original rx from November that he was not able to get at that time.

## 2015-05-28 ENCOUNTER — Encounter: Payer: Self-pay | Admitting: Gastroenterology

## 2015-05-28 ENCOUNTER — Ambulatory Visit (INDEPENDENT_AMBULATORY_CARE_PROVIDER_SITE_OTHER): Payer: BLUE CROSS/BLUE SHIELD | Admitting: Gastroenterology

## 2015-05-28 VITALS — BP 148/92 | HR 129 | Temp 98.3°F | Ht 69.0 in | Wt 153.2 lb

## 2015-05-28 DIAGNOSIS — K219 Gastro-esophageal reflux disease without esophagitis: Secondary | ICD-10-CM

## 2015-05-28 NOTE — Assessment & Plan Note (Signed)
Doing well on Dexilant once daily. Evaluated by cardiology due to precordial pain and had negative stress test. Stable from a GI standpoint. Return in 1 year or sooner if needed.

## 2015-05-28 NOTE — Progress Notes (Signed)
Referring Provider: Ginger Organ Primary Care Physician:  Collene Mares, PA-C  Primary GI: Dr. Gala Romney   Chief Complaint  Patient presents with  . Follow-up    HPI:   Daniel Atkinson is a 50 y.o. male presenting today with a history of GERD and dyspepsia. EGD/colonoscopy up-to-date. Next colonoscopy due in 2021.    Taking Dexilant, everything much improved. Had to go through insurance approval process and fail other PPIs. No N/V. No abdominal pain. No constipation or diarrhea. Recent stress test Oct 2016 normal. Weight up from last visits. Watching diet, avoiding trigger foods. Tries not to overeat. Drinking lots of water.   Past Medical History  Diagnosis Date  . Essential hypertension   . Hiatal hernia   . GERD (gastroesophageal reflux disease)   . Anxiety   . Headache     Past Surgical History  Procedure Laterality Date  . Appendectomy    . Colonoscopy with propofol N/A 11/22/2014    RMR: single colonic polyp removed, tubular adenoma  . Esophagogastroduodenoscopy (egd) with propofol N/A 11/22/2014    Dr. Rourk:abnormal  gastric mucosa of doubtful clinical significance status post biopsy. mild chronic gastritis.   . Biopsy N/A 11/22/2014    Procedure: BIOPSY;  Surgeon: Daneil Dolin, MD;  Location: [facility];  Service: Endoscopy;  Laterality: N/A;  gastric,   . Polypectomy N/A 11/22/2014    Procedure: POLYPECTOMY;  Surgeon: Daneil Dolin, MD;  Location: [facility];  Service: Endoscopy;  Laterality: N/A;  ascending colon     Current Outpatient Prescriptions  Medication Sig Dispense Refill  . amitriptyline (ELAVIL) 10 MG tablet Take 10 mg by mouth at bedtime.    Marland Kitchen dexlansoprazole (DEXILANT) 60 MG capsule Take 1 capsule (60 mg total) by mouth daily. 90 capsule 3  . docusate sodium (COLACE) 100 MG capsule Take 100 mg by mouth daily as needed for mild constipation.    Marland Kitchen lisinopril (PRINIVIL,ZESTRIL) 10 MG tablet Take 10 mg by mouth daily.  5  . magnesium citrate SOLN Take 1  Bottle by mouth as needed for severe constipation.    . Multiple Vitamin (ONE-A-DAY MENS PO) Take 1 tablet by mouth daily.    . Potassium (POTASSIMIN PO) Take 1 tablet by mouth daily.    . sucralfate (CARAFATE) 1 G tablet Take 1 tablet (1 g total) by mouth 4 (four) times daily -  with meals and at bedtime. 30 tablet 0  . acetaminophen (TYLENOL) 500 MG tablet Take 500 mg by mouth every 6 (six) hours as needed for mild pain. Reported on 05/28/2015    . bismuth subsalicylate (PEPTO BISMOL) 262 MG chewable tablet Chew 524 mg by mouth as needed. Reported on 05/28/2015    . RABEprazole (ACIPHEX) 20 MG tablet Take 1 tablet (20 mg total) by mouth daily. (Patient not taking: Reported on 05/28/2015) 30 tablet 1  . Simethicone (MYLANTA GAS PO) Take 30 mLs by mouth as needed. Reported on 05/28/2015    . traMADol (ULTRAM) 50 MG tablet Take 50 mg by mouth every 6 (six) hours as needed for moderate pain. Reported on 05/28/2015    . zolpidem (AMBIEN) 10 MG tablet Reported on 05/28/2015  1   No current facility-administered medications for this visit.    Allergies as of 05/28/2015 - Review Complete 05/28/2015  Allergen Reaction Noted  . Hydrocodone Nausea And Vomiting 08/09/2014    Family History  Problem Relation Age of Onset  . Hypertension Mother   . Hypertension Father   .  Colon cancer Neg Hx   . CAD Father     Died age 45    Social History   Social History  . Marital Status: Married    Spouse Name: N/A  . Number of Children: N/A  . Years of Education: N/A   Social History Main Topics  . Smoking status: Former Smoker -- 1.00 packs/day for 15 years    Types: Cigarettes    Quit date: 11/15/2002  . Smokeless tobacco: Never Used     Comment: Quit x 12 years  . Alcohol Use: No  . Drug Use: No  . Sexual Activity: Yes    Birth Control/ Protection: None   Other Topics Concern  . None   Social History Narrative    Review of Systems: As mentioned in HPI   Physical Exam: BP 148/92 mmHg  Pulse  129  Temp(Src) 98.3 F (36.8 C) (Oral)  Ht 5\' 9"  (1.753 m)  Wt 153 lb 3.2 oz (69.491 kg)  BMI 22.61 kg/m2 General:   Alert and oriented. No distress noted. Pleasant and cooperative.  Head:  Normocephalic and atraumatic. Eyes:  Conjuctiva clear without scleral icterus. Abdomen:  +BS, soft, non-tender and non-distended. No rebound or guarding. No HSM or masses noted. Msk:  Symmetrical without gross deformities. Normal posture. Extremities:  Without edema. Neurologic:  Alert and  oriented x4;  grossly normal neurologically. Psych:  Alert and cooperative. Normal mood and affect.

## 2015-05-28 NOTE — Progress Notes (Signed)
cc'ed to pcp °

## 2015-05-28 NOTE — Patient Instructions (Signed)
Continue Dexilant once daily.  We will see you back in 1 year!

## 2015-12-05 DIAGNOSIS — K219 Gastro-esophageal reflux disease without esophagitis: Secondary | ICD-10-CM | POA: Diagnosis not present

## 2015-12-05 DIAGNOSIS — Z6823 Body mass index (BMI) 23.0-23.9, adult: Secondary | ICD-10-CM | POA: Diagnosis not present

## 2015-12-05 DIAGNOSIS — Z0000 Encounter for general adult medical examination without abnormal findings: Secondary | ICD-10-CM | POA: Diagnosis not present

## 2015-12-05 DIAGNOSIS — F329 Major depressive disorder, single episode, unspecified: Secondary | ICD-10-CM | POA: Diagnosis not present

## 2015-12-05 DIAGNOSIS — I10 Essential (primary) hypertension: Secondary | ICD-10-CM | POA: Diagnosis not present

## 2015-12-05 DIAGNOSIS — Z1389 Encounter for screening for other disorder: Secondary | ICD-10-CM | POA: Diagnosis not present

## 2016-03-04 ENCOUNTER — Other Ambulatory Visit: Payer: Self-pay | Admitting: Gastroenterology

## 2016-05-01 ENCOUNTER — Encounter: Payer: Self-pay | Admitting: Internal Medicine

## 2016-06-09 ENCOUNTER — Ambulatory Visit (INDEPENDENT_AMBULATORY_CARE_PROVIDER_SITE_OTHER): Payer: BLUE CROSS/BLUE SHIELD | Admitting: Internal Medicine

## 2016-06-09 ENCOUNTER — Encounter: Payer: Self-pay | Admitting: Internal Medicine

## 2016-06-09 VITALS — BP 126/83 | HR 90 | Temp 97.4°F | Ht 69.0 in | Wt 158.0 lb

## 2016-06-09 DIAGNOSIS — K219 Gastro-esophageal reflux disease without esophagitis: Secondary | ICD-10-CM

## 2016-06-09 DIAGNOSIS — Z86010 Personal history of colonic polyps: Secondary | ICD-10-CM | POA: Diagnosis not present

## 2016-06-09 NOTE — Patient Instructions (Addendum)
Continue Dexilant 60 mg daily  May use Sucralfate twice daily as needed  Plan for a colonoscopy in 2021  Office visit in 1 year

## 2016-06-09 NOTE — Progress Notes (Signed)
Primary Care Physician:  Glo Herring., MD Primary Gastroenterologist:  Dr.   Pre-Procedure History & Physical: HPI:  Daniel Atkinson is a 51 y.o. male here for follow-up of GERD. History of colonic adenoma. Significant issues with reflux last year. Finally ended up on the Dexilant 60 mg daily with excellent control. Had occasional breakthrough symptoms -  was placed briefly on Carafate 1 g 4 times daily that was backed off to 1 g twice daily. No dysphagia. No abdominal pain. No melena or rectal bleeding. History of colonic adenoma; due for surveillance colonoscopy 2021  Was having some atypical chest pain last year. Saw the cardiologist who felt his heart was doing okay and not the cause of his chest pain (which has resolved).  Past Medical History:  Diagnosis Date  . Anxiety   . Essential hypertension   . GERD (gastroesophageal reflux disease)   . Headache   . Hiatal hernia     Past Surgical History:  Procedure Laterality Date  . APPENDECTOMY    . BIOPSY N/A 11/22/2014   Procedure: BIOPSY;  Surgeon: Daneil Dolin, MD;  Location: [facility];  Service: Endoscopy;  Laterality: N/A;  gastric,   . COLONOSCOPY WITH PROPOFOL N/A 11/22/2014   RMR: single colonic polyp removed, tubular adenoma  . ESOPHAGOGASTRODUODENOSCOPY (EGD) WITH PROPOFOL N/A 11/22/2014   Dr. Amear Strojny:abnormal  gastric mucosa of doubtful clinical significance status post biopsy. mild chronic gastritis.   Marland Kitchen POLYPECTOMY N/A 11/22/2014   Procedure: POLYPECTOMY;  Surgeon: Daneil Dolin, MD;  Location: [facility];  Service: Endoscopy;  Laterality: N/A;  ascending colon     Prior to Admission medications   Medication Sig Start Date End Date Taking? Authorizing Provider  amitriptyline (ELAVIL) 10 MG tablet Take 10 mg by mouth at bedtime.   Yes Historical Provider, MD  DEXILANT 60 MG capsule TAKE 1 CAPSULE(60 MG) BY MOUTH DAILY 03/05/16  Yes Annitta Needs, NP  docusate sodium (COLACE) 100 MG capsule Take 100 mg by mouth daily as  needed for mild constipation.   Yes Historical Provider, MD  lisinopril (PRINIVIL,ZESTRIL) 10 MG tablet Take 10 mg by mouth daily. 08/07/14  Yes Historical Provider, MD  Multiple Vitamin (ONE-A-DAY MENS PO) Take 1 tablet by mouth daily.   Yes Historical Provider, MD  Potassium (POTASSIMIN PO) Take 1 tablet by mouth daily.   Yes Historical Provider, MD  magnesium citrate SOLN Take 1 Bottle by mouth as needed for severe constipation.    Historical Provider, MD  sucralfate (CARAFATE) 1 G tablet Take 1 tablet (1 g total) by mouth 4 (four) times daily -  with meals and at bedtime. Patient not taking: Reported on 06/09/2016 08/09/14   Noemi Chapel, MD    Allergies as of 06/09/2016 - Review Complete 05/28/2015  Allergen Reaction Noted  . Hydrocodone Nausea And Vomiting 08/09/2014    Family History  Problem Relation Age of Onset  . Hypertension Mother   . Hypertension Father   . Colon cancer Neg Hx   . CAD Father     Died age 49    Social History   Social History  . Marital status: Married    Spouse name: N/A  . Number of children: N/A  . Years of education: N/A   Occupational History  . Not on file.   Social History Main Topics  . Smoking status: Former Smoker    Packs/day: 1.00    Years: 15.00    Types: Cigarettes    Quit date: 11/15/2002  .  Smokeless tobacco: Never Used     Comment: Quit x 12 years  . Alcohol use No  . Drug use: No  . Sexual activity: Yes    Birth control/ protection: None   Other Topics Concern  . Not on file   Social History Narrative  . No narrative on file    Review of Systems: See HPI, otherwise negative ROS  Physical Exam: BP 126/83   Pulse 90   Temp 97.4 F (36.3 C) (Oral)   Ht 5\' 9"  (1.753 m)   Wt 158 lb (71.7 kg)   BMI 23.33 kg/m  General:   Alert,  Well-developed, well-nourished, pleasant and cooperative in NAD Abdomen: Non-distended, normal bowel sounds.  Soft and nontender without appreciable mass or hepatosplenomegaly.  Pulses:   Normal pulses noted. Extremities:  Without clubbing or edema.  Impression:  Pleasant 51 year old gentleman with the GERD now well controlled on Dexilant. He takes Carafate twice a day which is probably superfluous as a scheduled agent.  He was congratulated on improving his eating habits. Colonic adenoma; due for surveillance 2021  Recommendations:  Continue Dexilant 60 mg daily  May use Sucralfate twice daily as needed  Plan for a colonoscopy in 2021  Office visit in 1 year        Notice: This dictation was prepared with Dragon dictation along with smaller phrase technology. Any transcriptional errors that result from this process are unintentional and may not be corrected upon review.

## 2016-06-17 DIAGNOSIS — H35033 Hypertensive retinopathy, bilateral: Secondary | ICD-10-CM | POA: Diagnosis not present

## 2016-07-29 DIAGNOSIS — H40013 Open angle with borderline findings, low risk, bilateral: Secondary | ICD-10-CM | POA: Diagnosis not present

## 2016-08-03 DIAGNOSIS — F329 Major depressive disorder, single episode, unspecified: Secondary | ICD-10-CM | POA: Diagnosis not present

## 2016-08-03 DIAGNOSIS — G4700 Insomnia, unspecified: Secondary | ICD-10-CM | POA: Diagnosis not present

## 2016-08-03 DIAGNOSIS — I10 Essential (primary) hypertension: Secondary | ICD-10-CM | POA: Diagnosis not present

## 2016-08-03 DIAGNOSIS — Z1389 Encounter for screening for other disorder: Secondary | ICD-10-CM | POA: Diagnosis not present

## 2016-08-03 DIAGNOSIS — K219 Gastro-esophageal reflux disease without esophagitis: Secondary | ICD-10-CM | POA: Diagnosis not present

## 2016-08-03 DIAGNOSIS — Z6822 Body mass index (BMI) 22.0-22.9, adult: Secondary | ICD-10-CM | POA: Diagnosis not present

## 2016-08-19 IMAGING — US US ABDOMEN LIMITED
1 series · 14 of 25 positions shown · non-contrast
Comparison: Abdominal radiograph - earlier same day

CLINICAL DATA: Abdominal pain for 5 days. Possible gallstones seen
on radiograph.

EXAM:
US ABDOMEN LIMITED - RIGHT UPPER QUADRANT

[Series 1: us abdomen limited · 0.17mm/px · 14 of 43 slices shown]
[im 1/43]
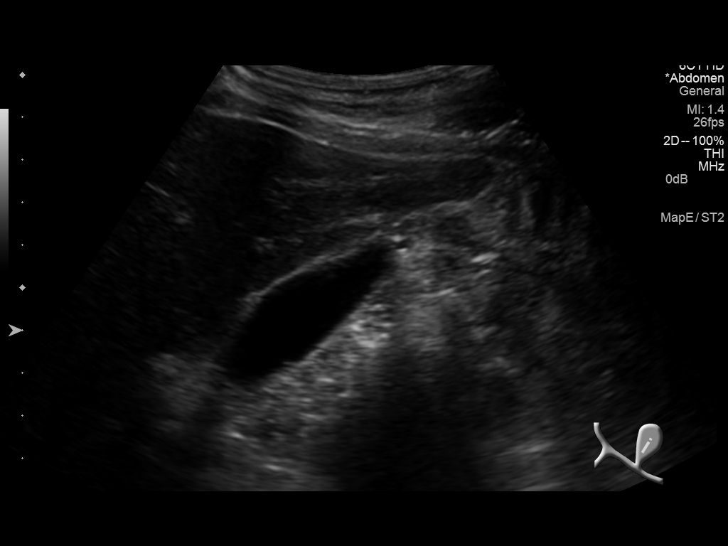
[im 4/43]
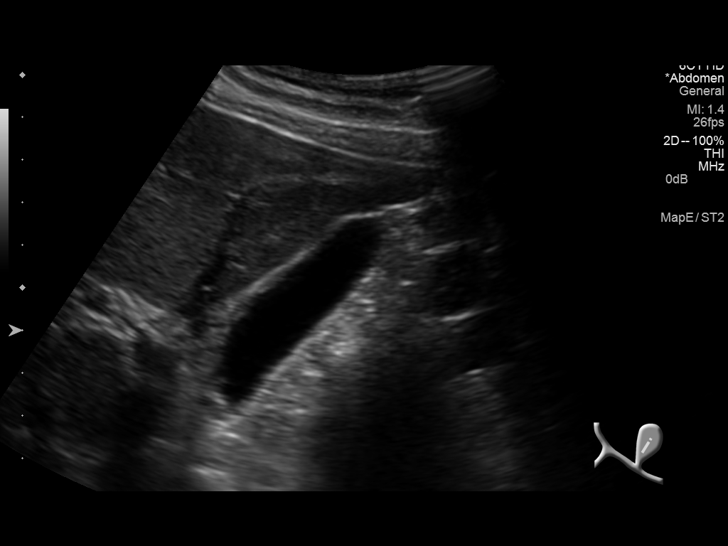
[im 8/43]
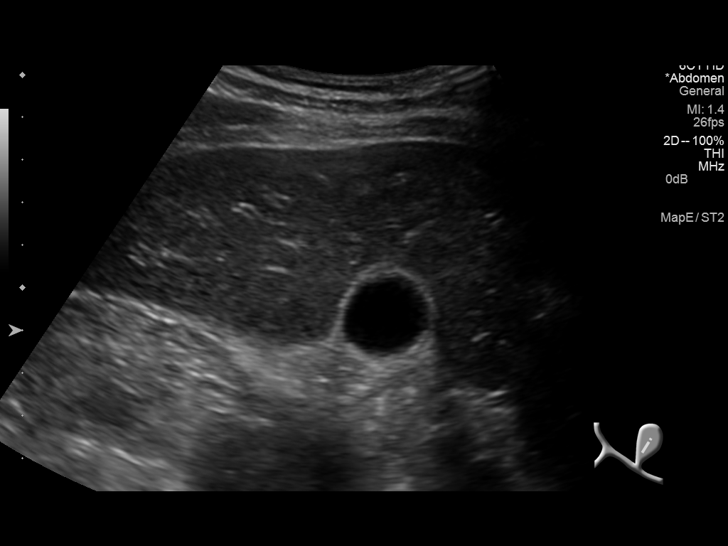
[im 11/43]
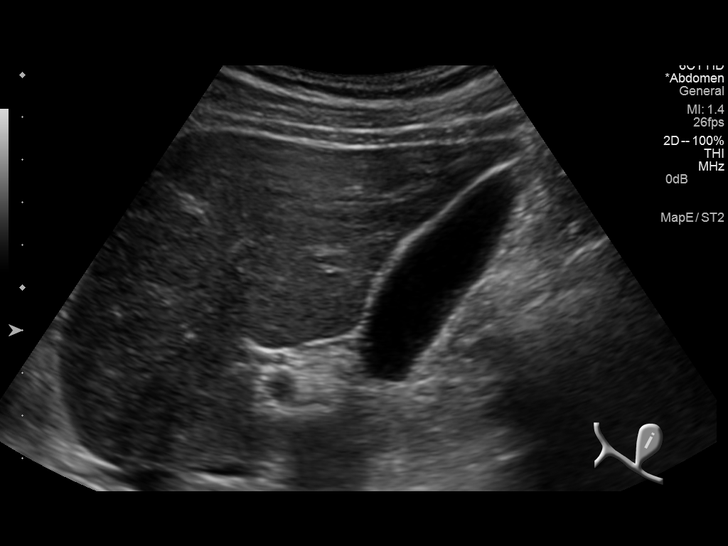
[im 15/43]
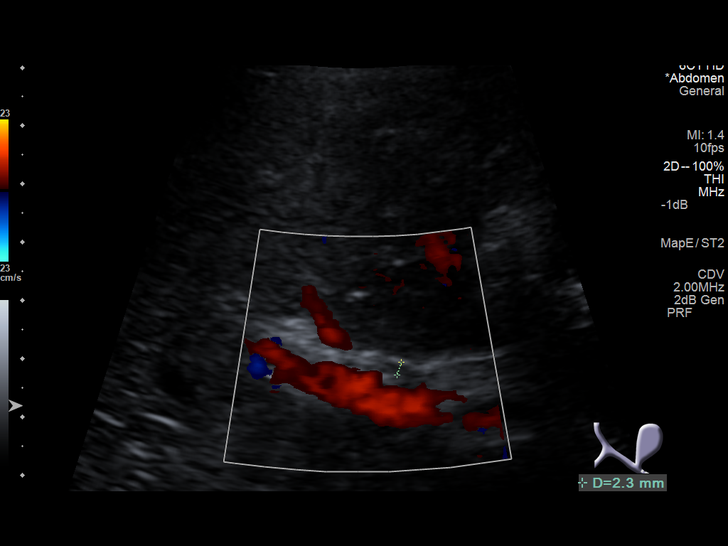
[im 16/43]
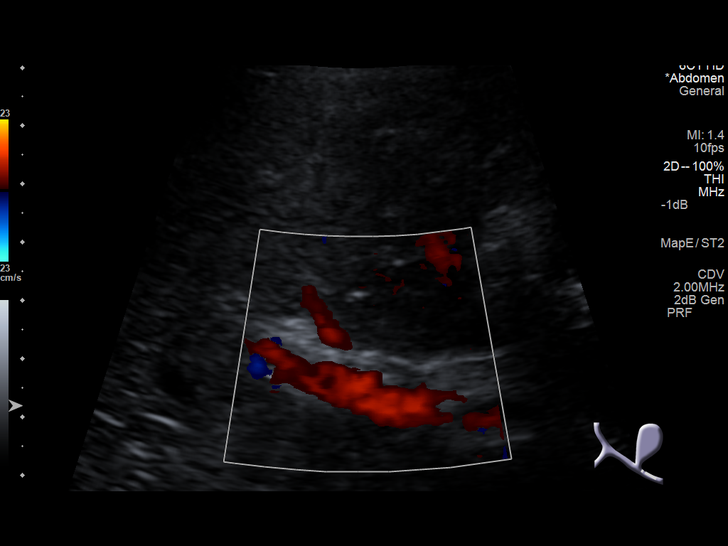
[im 20/43]
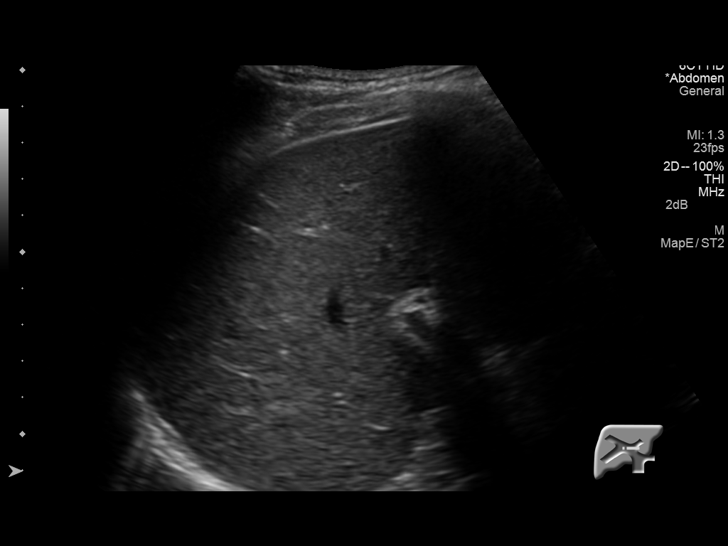
[im 23/43]
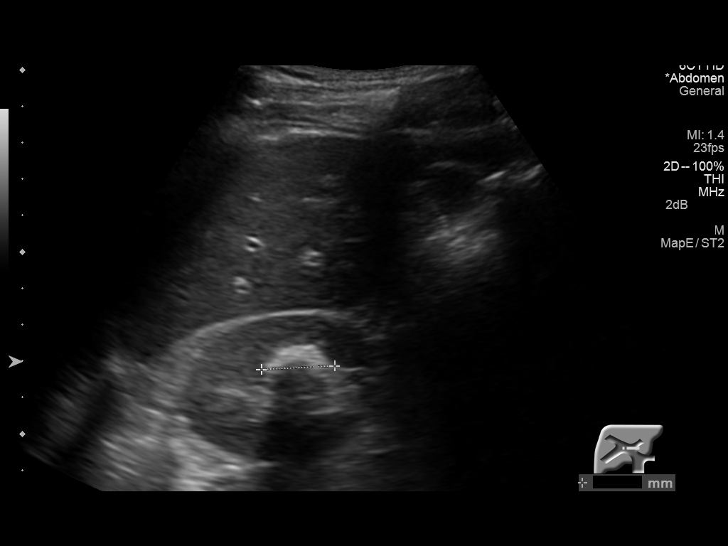
[im 27/43]
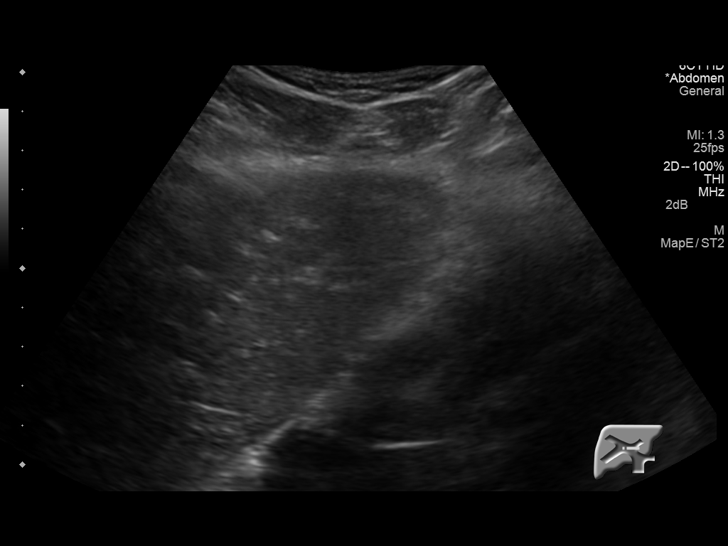
[im 29/43]
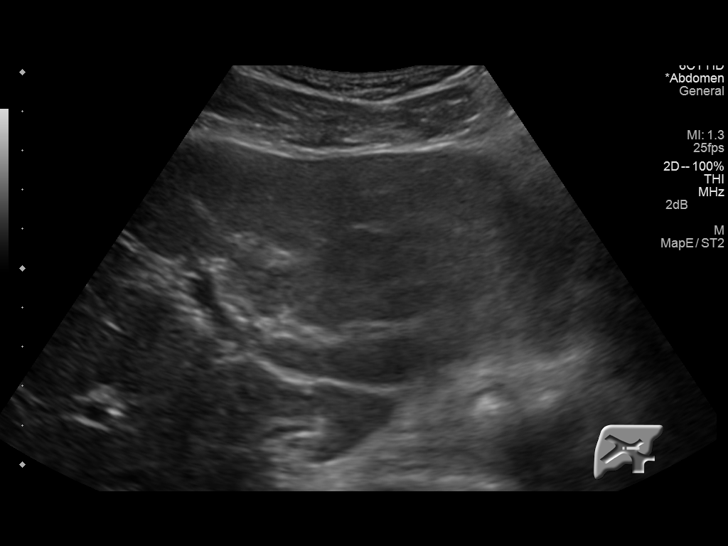
[im 32/43]
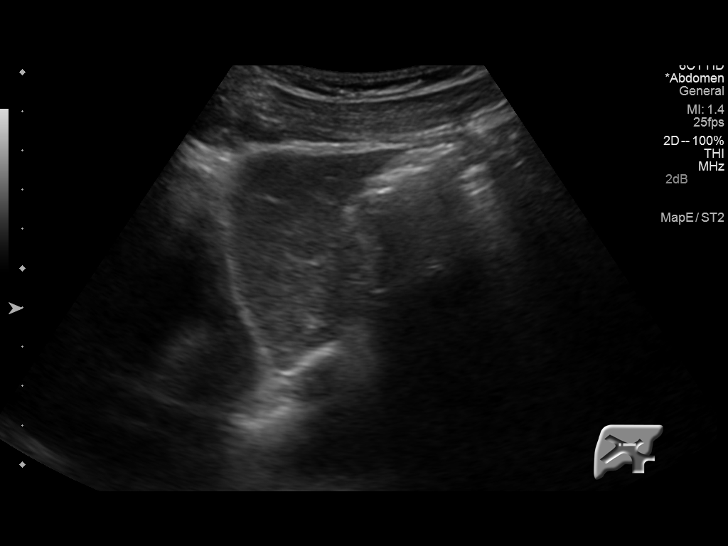
[im 36/43]
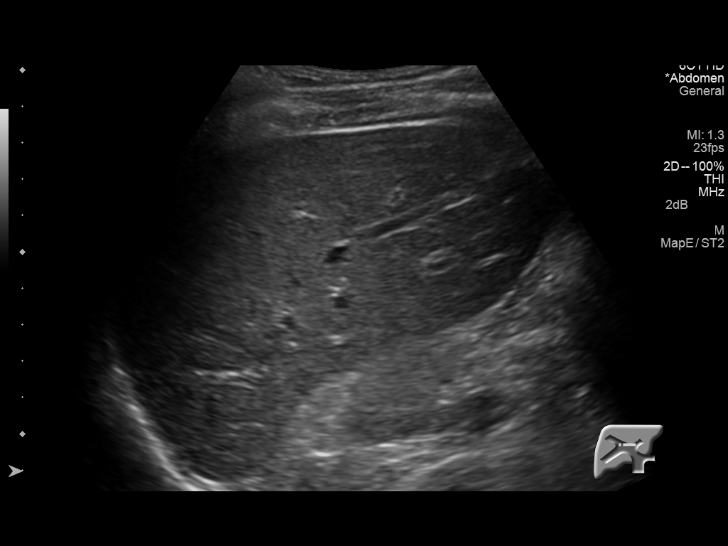
[im 39/43]
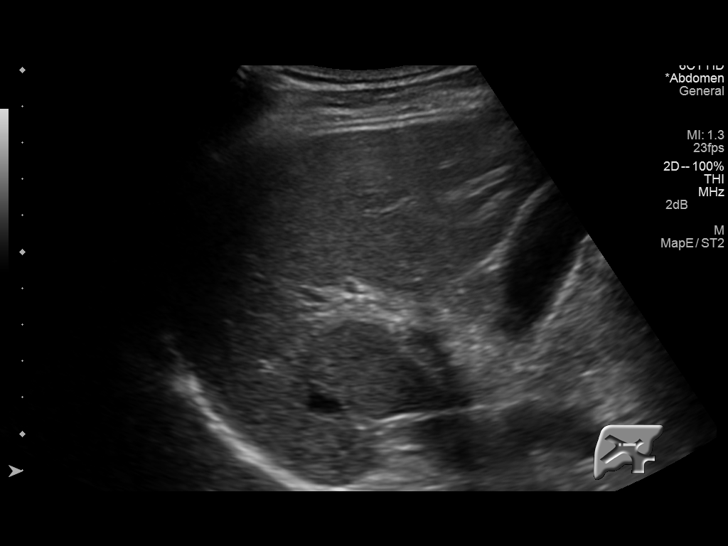
[im 43/43]
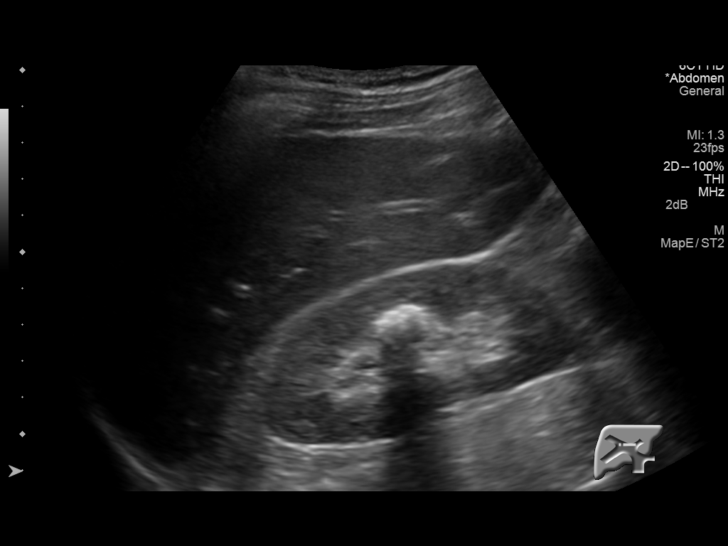

[14 of 25 positions shown; findings below may reference images not displayed]

FINDINGS: Gallbladder:

Sonographically normal. No echogenic gallstones or gall sludge. No
gallbladder wall thickening or pericholecystic fluid. Negative
sonographic Murphy's sign.

Common bile duct:

Diameter: Normal in size measuring 2.3 mm in diameter

Liver:

Homogeneous hepatic echotexture. No discrete hepatic lesions. No
definite evidence of intrahepatic biliary ductal dilatation. No
ascites.

Instilling noted is an approximately 2.0 x 1.7 x 1.2 cm echogenic
shadowing stone within the incidentally imaged superior/mid aspect
of the right kidney (images 21 and 39).
IMPRESSION: 1. No evidence of cholelithiasis.
2. Suspected approximately 2.0 cm echogenic shadowing stone within
the superior/mid aspect of the right kidney - this stone correlates
with the stone seen on preceding abdominal radiograph. Further
evaluation with noncontrast abdominal CT could be performed as
clinically indicated.

## 2016-09-01 IMAGING — CT CT ABD-PELV W/ CM
2 of 5 series · 16 of 46 positions shown, 18 images · IV contrast (Omnipaque 300)
Comparison: Radiograph sound ultrasound 08/09/2014

CLINICAL DATA: Epigastric abdominal pain for 1 week.

EXAM:
CT ABDOMEN AND PELVIS WITH CONTRAST
TECHNIQUE: Multidetector CT imaging of the abdomen and pelvis was performed
using the standard protocol following bolus administration of
intravenous contrast.
CONTRAST:  50mL OMNIPAQUE IOHEXOL 300 MG/ML SOLN, 100mL OMNIPAQUE
IOHEXOL 300 MG/ML SOLN

[Series 2: abd_pel_with 5.0 b40f · axial · 0.66mm/px · z∈[-490,-115]mm · 13 of 85 slices shown, 15 images]
[im 5/85  soft-tissue]
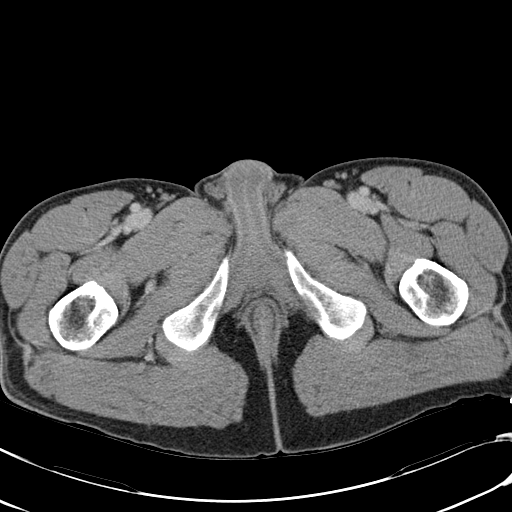
[im 5/85  bone]
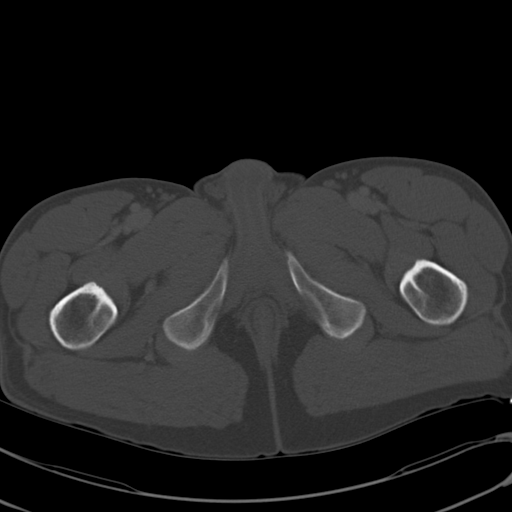
[im 14/85  soft-tissue]
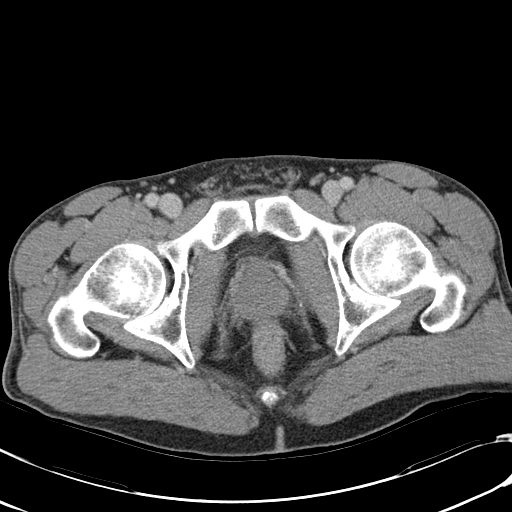
[im 18/85  soft-tissue]
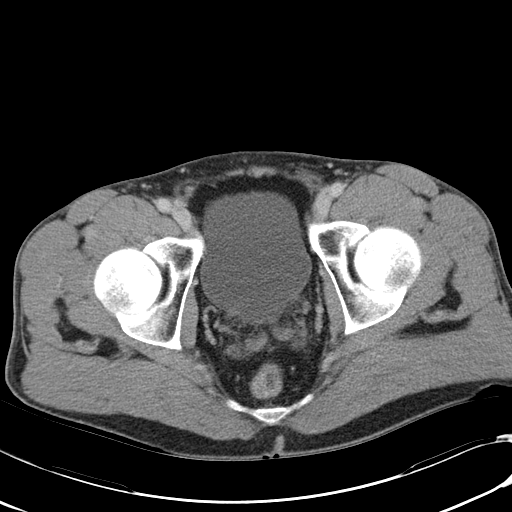
[im 23/85  soft-tissue]
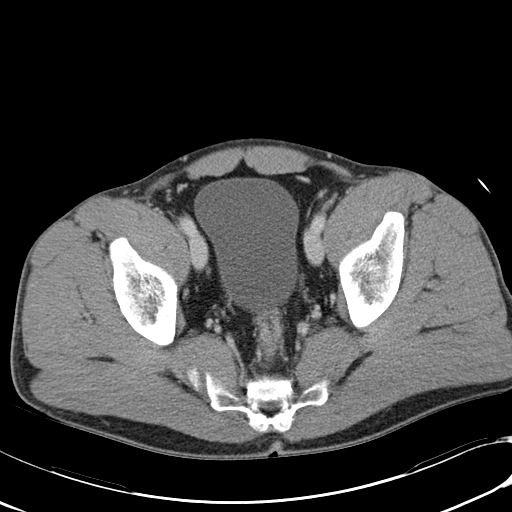
[im 31/85  soft-tissue]
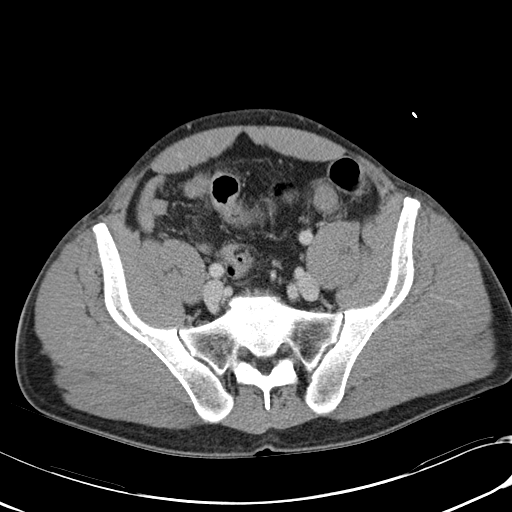
[im 36/85  soft-tissue]
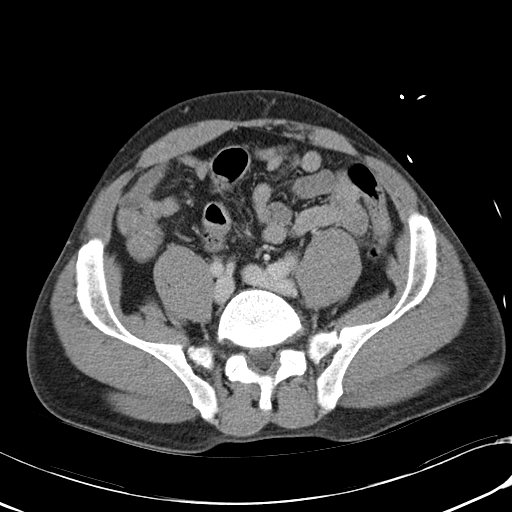
[im 45/85  soft-tissue]
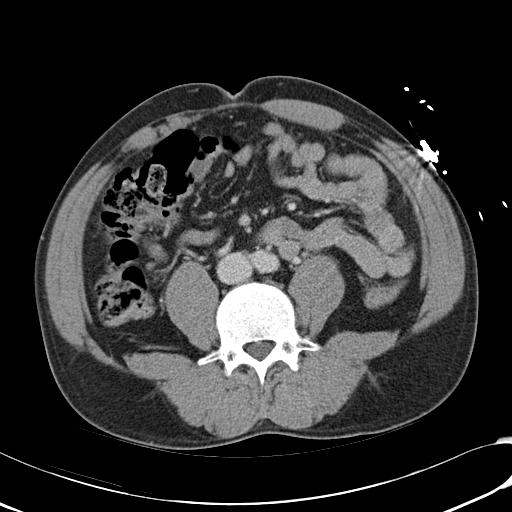
[im 49/85  soft-tissue]
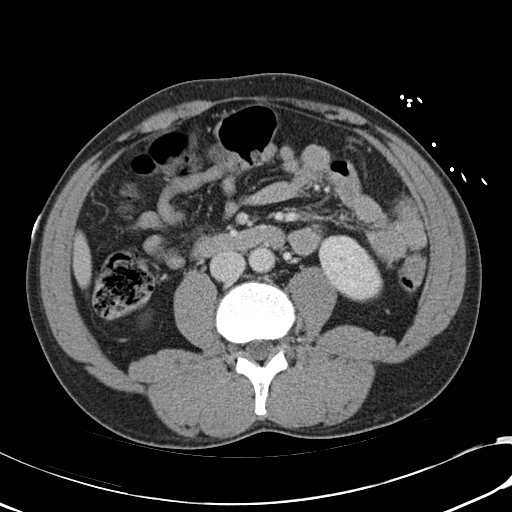
[im 54/85  soft-tissue]
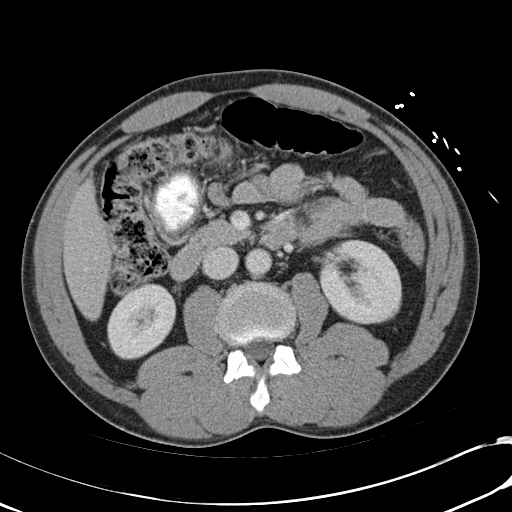
[im 54/85  bone]
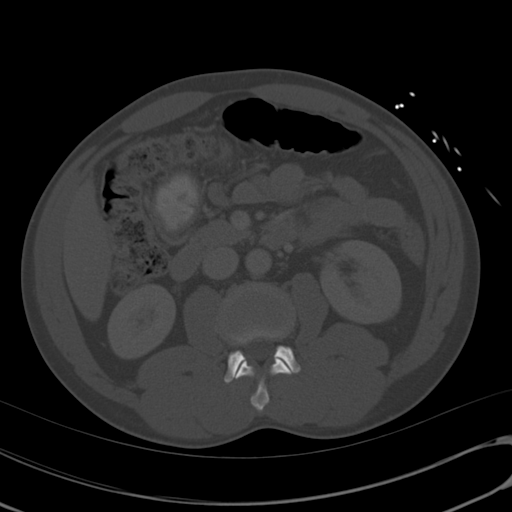
[im 62/85  soft-tissue]
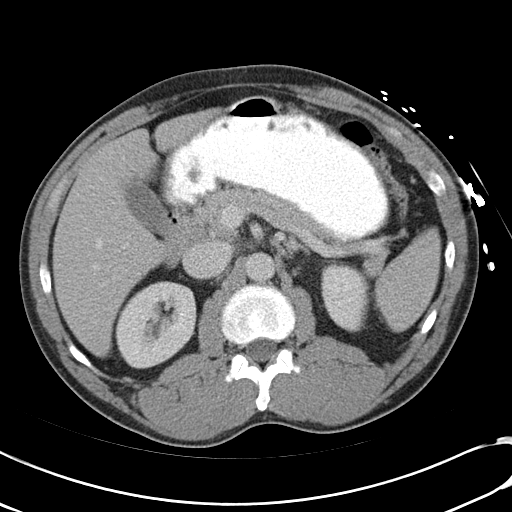
[im 67/85  soft-tissue]
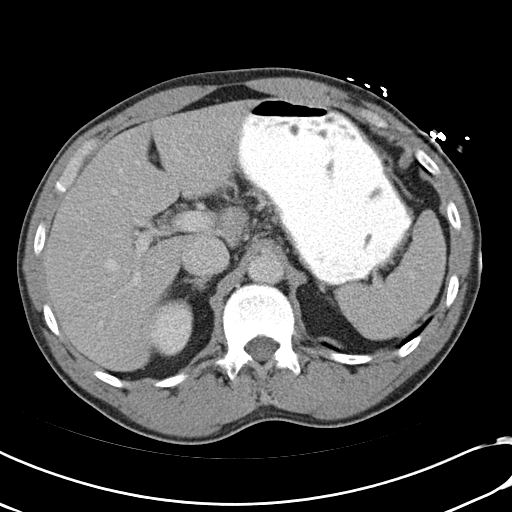
[im 71/85  soft-tissue]
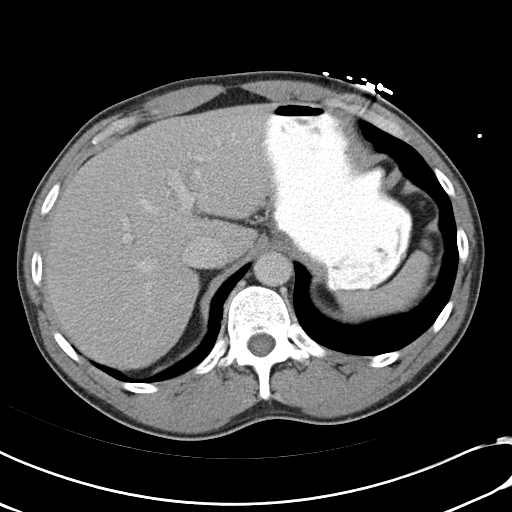
[im 80/85  soft-tissue]
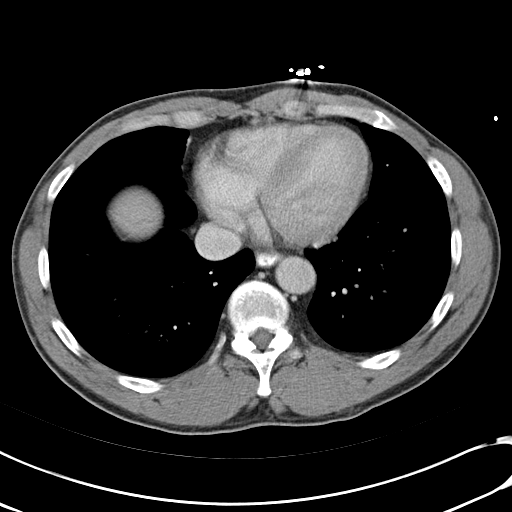

[Series 4: abd_pel_with 3.0 spo · coronal · 0.66mm/px · 3 of 80 slices shown]
[im 27/80  soft-tissue]
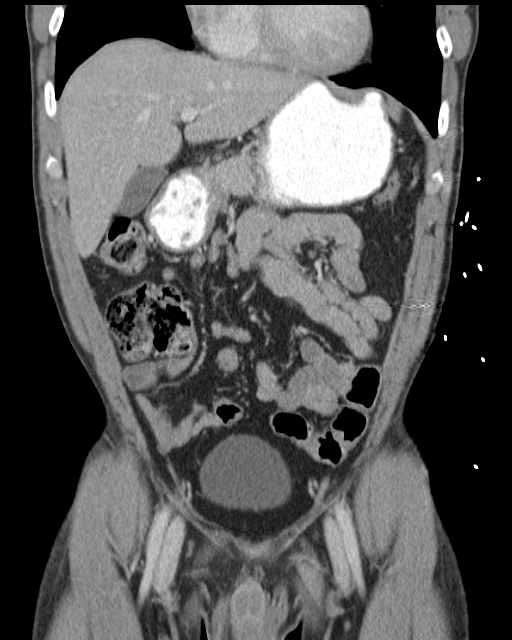
[im 36/80  soft-tissue]
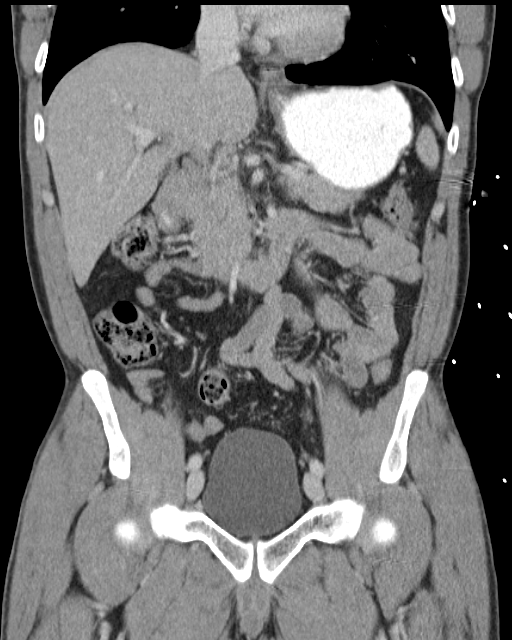
[im 44/80  soft-tissue]
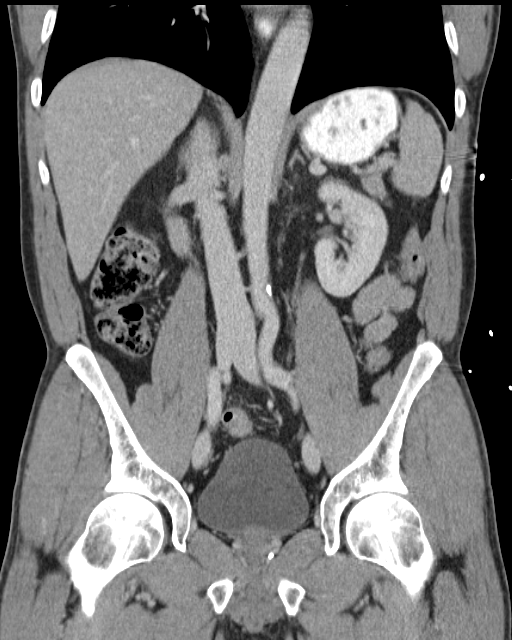

[16 of 46 positions shown; findings below may reference images not displayed]

FINDINGS: The included lung bases are clear.

The liver, gallbladder, spleen, pancreas, and adrenal glands are
normal.

The kidneys demonstrate symmetric enhancement without
hydronephrosis. There is a lobulated 1.4 x 1.1 cm stone versus
clustered stones in the interpolar right kidney without
hydronephrosis. No additional renal stones.

The stomach is physiologically distended. Small hiatal hernia. There
are no dilated or thickened bowel loops. The appendix is not
identified. Moderate stool in the right colon, small volume of stool
in the distal colon. No colonic wall thickening. No significant
diverticular disease. No free air, free fluid, or intra-abdominal
fluid collection.

No retroperitoneal adenopathy. Abdominal aorta is normal in caliber.
There is minimal atherosclerosis.

Within the pelvis the bladder is physiologically distended. Prostate
gland is normal in size. No pelvic free fluid or adenopathy.

Degenerative disc disease at L5-S1. There are no acute or suspicious
osseous abnormalities.
IMPRESSION: 1. No acute abnormality in the abdomen/pelvis.
2. Lobulated 1.4 x 1.1 cm right renal stone versus cluster of small
stones. No obstructive uropathy.
3. Small hiatal hernia.

## 2017-03-01 ENCOUNTER — Other Ambulatory Visit: Payer: Self-pay | Admitting: Gastroenterology

## 2017-03-03 ENCOUNTER — Telehealth: Payer: Self-pay

## 2017-03-03 NOTE — Telephone Encounter (Signed)
Pt walked in our office asking for samples of Dexilant. Pt states that his pharmacy or pt assistance program should be send our office some paperwork since his medication is out.   Looked at pts last 05/2016 and pt is to take Dexilant one capsule daily or one capsule twice daily. Samples given to pt.

## 2017-04-09 DIAGNOSIS — H40011 Open angle with borderline findings, low risk, right eye: Secondary | ICD-10-CM | POA: Diagnosis not present

## 2017-04-20 ENCOUNTER — Encounter: Payer: Self-pay | Admitting: Internal Medicine

## 2017-06-08 ENCOUNTER — Ambulatory Visit (INDEPENDENT_AMBULATORY_CARE_PROVIDER_SITE_OTHER): Payer: BLUE CROSS/BLUE SHIELD | Admitting: Internal Medicine

## 2017-06-08 ENCOUNTER — Encounter: Payer: Self-pay | Admitting: Internal Medicine

## 2017-06-08 VITALS — BP 133/79 | HR 99 | Temp 97.2°F | Ht 69.0 in | Wt 138.8 lb

## 2017-06-08 DIAGNOSIS — Z86010 Personal history of colonic polyps: Secondary | ICD-10-CM | POA: Diagnosis not present

## 2017-06-08 DIAGNOSIS — K219 Gastro-esophageal reflux disease without esophagitis: Secondary | ICD-10-CM | POA: Diagnosis not present

## 2017-06-08 NOTE — Patient Instructions (Signed)
Great Job on weight loss!  Continue Dexilant daily  May Continue carafate (sucralfate tablets - greenstone brand) as needed  GERD information provided  Colonoscopy 2021  Return visit in 1 year

## 2017-06-08 NOTE — Progress Notes (Signed)
Primary Care Physician:  Redmond School, MD Primary Gastroenterologist:  Dr. Gala Romney  Pre-Procedure History & Physical: HPI:  Daniel Atkinson is a 52 y.o. male here for follow-up of GERD. History of colonic adenoma; due for surveillance colonoscopy 2021.  Patient took my advice to heart last year  -  has lost 20 pounds in an effort o achieve better health.  Reflux symptoms well controlled on Dexilant 60 mg daily. He occasionally has breakthrough for which he takes Carafate tablet. He likes green stone generic better than others.  No dysphagia. No rectal bleeding. Bowels moving well aside from occasional constipation.  Past Medical History:  Diagnosis Date  . Anxiety   . Essential hypertension   . GERD (gastroesophageal reflux disease)   . Headache   . Hiatal hernia     Past Surgical History:  Procedure Laterality Date  . APPENDECTOMY    . BIOPSY N/A 11/22/2014   Procedure: BIOPSY;  Surgeon: Daneil Dolin, MD;  Location: [facility];  Service: Endoscopy;  Laterality: N/A;  gastric,   . COLONOSCOPY WITH PROPOFOL N/A 11/22/2014   RMR: single colonic polyp removed, tubular adenoma  . ESOPHAGOGASTRODUODENOSCOPY (EGD) WITH PROPOFOL N/A 11/22/2014   Dr. Rosaleen Mazer:abnormal  gastric mucosa of doubtful clinical significance status post biopsy. mild chronic gastritis.   Marland Kitchen POLYPECTOMY N/A 11/22/2014   Procedure: POLYPECTOMY;  Surgeon: Daneil Dolin, MD;  Location: [facility];  Service: Endoscopy;  Laterality: N/A;  ascending colon     Prior to Admission medications   Medication Sig Start Date End Date Taking? Authorizing Provider  ALPRAZolam (XANAX PO) Take by mouth. 1 at night   Yes [provider]  Calcium Carbonate Antacid (ANTACID PO) Take by mouth. 2 tsps 1-2 times per week   Yes [provider]  Cholecalciferol (VITAMIN D) 2000 units CAPS Take 1 capsule by mouth daily.   Yes [provider]  DEXILANT 60 MG capsule TAKE 1 CAPSULE(60 MG) BY MOUTH DAILY 03/02/17  Yes  Annitta Needs, NP  lisinopril (PRINIVIL,ZESTRIL) 10 MG tablet Take 10 mg by mouth daily. 08/07/14  Yes [provider]  sucralfate (CARAFATE) 1 G tablet Take 1 tablet (1 g total) by mouth 4 (four) times daily -  with meals and at bedtime. Patient taking differently: 1 every 2-3 days 08/09/14  Yes Noemi Chapel, MD  amitriptyline (ELAVIL) 10 MG tablet Take 10 mg by mouth at bedtime.    [provider]  docusate sodium (COLACE) 100 MG capsule Take 100 mg by mouth daily as needed for mild constipation.    [provider]  magnesium citrate SOLN Take 1 Bottle by mouth as needed for severe constipation.    [provider]  Multiple Vitamin (ONE-A-DAY MENS PO) Take 1 tablet by mouth daily.    [provider]  Potassium (POTASSIMIN PO) Take 1 tablet by mouth daily.    [provider]    Allergies as of 06/08/2017 - Review Complete 06/08/2017  Allergen Reaction Noted  . Hydrocodone Nausea And Vomiting 08/09/2014    Family History  Problem Relation Age of Onset  . Hypertension Mother   . Hypertension Father   . Colon cancer Neg Hx   . CAD Father        Died age 11    Social History   Socioeconomic History  . Marital status: Married    Spouse name: Not on file  . Number of children: Not on file  . Years of education: Not on  file  . Highest education level: Not on file  Social Needs  . Financial resource strain: Not on file  . Food insecurity - worry: Not on file  . Food insecurity - inability: Not on file  . Transportation needs - medical: Not on file  . Transportation needs - non-medical: Not on file  Occupational History  . Not on file  Tobacco Use  . Smoking status: Former Smoker    Packs/day: 1.00    Years: 15.00    Pack years: 15.00    Types: Cigarettes    Last attempt to quit: 11/15/2002    Years since quitting: 14.5  . Smokeless tobacco: Never Used  . Tobacco comment: Quit x 12 years  Substance and Sexual Activity  .  Alcohol use: No    Alcohol/week: 0.0 oz  . Drug use: No  . Sexual activity: Yes    Birth control/protection: None  Other Topics Concern  . Not on file  Social History Narrative  . Not on file    Review of Systems: See HPI, otherwise negative ROS  Physical Exam: BP 133/79   Pulse 99   Temp (!) 97.2 F (36.2 C) (Oral)   Ht 5\' 9"  (1.753 m)   Wt 138 lb 12.8 oz (63 kg)   BMI 20.50 kg/m  General:   Alert,  Well-developed, well-nourished, pleasant and cooperative in NAD Neck:  Supple; no masses or thyromegaly. No significant cervical adenopathy. Lungs:  Clear throughout to auscultation.   No wheezes, crackles, or rhonchi. No acute distress. Heart:  Regular rate and rhythm; no murmurs, clicks, rubs,  or gallops. Abdomen: Non-distended, normal bowel sounds.  Soft and nontender without appreciable mass or hepatosplenomegaly.  Pulses:  Normal pulses noted. Extremities:  Without clubbing or edema.  Impression: Great job on conscious effort at weight loss. GERD well controlled on Dexilant.  Infrequent breakthrough symptoms. No alarm features.  History diverticulosis and colonic polyp  Recommendations:    Great Job on weight loss!  Continue Dexilant daily  May Continue carafate (sucralfate tablets - greenstone brand) as needed  GERD information provided  Colonoscopy 2021  Return visit in 1 year     Notice: This dictation was prepared with Dragon dictation along with smaller phrase technology. Any transcriptional errors that result from this process are unintentional and may not be corrected upon review.

## 2017-06-24 DIAGNOSIS — H40013 Open angle with borderline findings, low risk, bilateral: Secondary | ICD-10-CM | POA: Diagnosis not present

## 2017-07-04 ENCOUNTER — Encounter (HOSPITAL_COMMUNITY): Payer: Self-pay | Admitting: Emergency Medicine

## 2017-07-04 ENCOUNTER — Other Ambulatory Visit: Payer: Self-pay

## 2017-07-04 ENCOUNTER — Emergency Department (HOSPITAL_COMMUNITY)
Admission: EM | Admit: 2017-07-04 | Discharge: 2017-07-04 | Disposition: A | Discharge status: Home / Self Care | Payer: BLUE CROSS/BLUE SHIELD | Attending: Emergency Medicine | Admitting: Emergency Medicine

## 2017-07-04 ENCOUNTER — Emergency Department (HOSPITAL_COMMUNITY): Payer: BLUE CROSS/BLUE SHIELD

## 2017-07-04 DIAGNOSIS — K2100 Gastro-esophageal reflux disease with esophagitis, without bleeding: Secondary | ICD-10-CM

## 2017-07-04 DIAGNOSIS — Z79899 Other long term (current) drug therapy: Secondary | ICD-10-CM | POA: Insufficient documentation

## 2017-07-04 DIAGNOSIS — R1030 Lower abdominal pain, unspecified: Secondary | ICD-10-CM | POA: Diagnosis not present

## 2017-07-04 DIAGNOSIS — K644 Residual hemorrhoidal skin tags: Secondary | ICD-10-CM | POA: Insufficient documentation

## 2017-07-04 DIAGNOSIS — Z87891 Personal history of nicotine dependence: Secondary | ICD-10-CM | POA: Insufficient documentation

## 2017-07-04 DIAGNOSIS — K649 Unspecified hemorrhoids: Secondary | ICD-10-CM | POA: Diagnosis not present

## 2017-07-04 DIAGNOSIS — I10 Essential (primary) hypertension: Secondary | ICD-10-CM | POA: Insufficient documentation

## 2017-07-04 LAB — COMPREHENSIVE METABOLIC PANEL
ALT: 62 U/L (ref 17–63)
AST: 42 U/L — AB (ref 15–41)
Albumin: 4.4 g/dL (ref 3.5–5.0)
Alkaline Phosphatase: 69 U/L (ref 38–126)
Anion gap: 12 (ref 5–15)
BUN: 13 mg/dL (ref 6–20)
CHLORIDE: 100 mmol/L — AB (ref 101–111)
CO2: 24 mmol/L (ref 22–32)
CREATININE: 1.02 mg/dL (ref 0.61–1.24)
Calcium: 10 mg/dL (ref 8.9–10.3)
GFR calc Af Amer: >60 mL/min (ref >60)
GFR calc non Af Amer: >60 mL/min (ref >60)
Glucose, Bld: 131 mg/dL — ABNORMAL HIGH (ref 65–99)
Potassium: 3.8 mmol/L (ref 3.5–5.1)
SODIUM: 136 mmol/L (ref 135–145)
Total Bilirubin: 0.7 mg/dL (ref 0.3–1.2)
Total Protein: 8.2 g/dL — ABNORMAL HIGH (ref 6.5–8.1)

## 2017-07-04 LAB — CBC WITH DIFFERENTIAL/PLATELET
BASOS ABS: 0 10*3/uL (ref 0.0–0.1)
Basophils Relative: 0 %
EOS PCT: 0 %
Eosinophils Absolute: 0 10*3/uL (ref 0.0–0.7)
HCT: 45.9 % (ref 39.0–52.0)
Hemoglobin: 15.8 g/dL (ref 13.0–17.0)
LYMPHS PCT: 16 %
Lymphs Abs: 0.9 10*3/uL (ref 0.7–4.0)
MCH: 30 pg (ref 26.0–34.0)
MCHC: 34.4 g/dL (ref 30.0–36.0)
MCV: 87.1 fL (ref 78.0–100.0)
Monocytes Absolute: 0.3 10*3/uL (ref 0.1–1.0)
Monocytes Relative: 6 %
NEUTROS PCT: 78 %
Neutro Abs: 4.3 10*3/uL (ref 1.7–7.7)
PLATELETS: 217 10*3/uL (ref 150–400)
RBC: 5.27 MIL/uL (ref 4.22–5.81)
RDW: 12.1 % (ref 11.5–15.5)
WBC: 5.6 10*3/uL (ref 4.0–10.5)

## 2017-07-04 LAB — LIPASE, BLOOD: Lipase: 34 U/L (ref 11–51)

## 2017-07-04 MED ORDER — SUCRALFATE 1 G PO TABS: 1.0000 g | ORAL_TABLET | Freq: Three times a day (TID) | ORAL | 0 refills | Status: DC

## 2017-07-04 MED ORDER — GI COCKTAIL ~~LOC~~
30.0000 mL | Freq: Once | ORAL | Status: AC
  Administered 2017-07-04: 30 mL via ORAL
  Filled 2017-07-04: qty 30

## 2017-07-04 NOTE — ED Provider Notes (Signed)
[facility] EMERGENCY DEPARTMENT Provider Note   CSN: [id number] Arrival date & time: 07/04/17  1403     History   Chief Complaint Chief Complaint  Patient presents with  . Hemorrhoids    HPI Daniel Atkinson is a 52 y.o. male.  HPI Patient states that he has been constipated recently and developed itching hemorrhoids 1.5 weeks ago.  He is been using hemorrhoid cream and suppositories.  He states he is able to reduce the hemorrhoids but they come about when he has a bowel movement.  Patient also had taken a laxative and is now having loose bowel movements.  He complains that his gastritis and reflux has worsened over this time.  Denies NSAID use or dietary changes.  States he has had decreased oral intake.  Complains of sore throat and acidic taste in the back of his mouth especially when waking up.  Continues to take Dexilant as previously prescribed.  Denies bloody stools but states he has small amount of red blood on the toilet paper when he wipes. Past Medical History:  Diagnosis Date  . Anxiety   . Essential hypertension   . GERD (gastroesophageal reflux disease)   . Headache   . Hiatal hernia     Patient Active Problem List   Diagnosis Date Noted  . History of colonic polyps   . Mucosal abnormality of stomach   . Encounter for screening colonoscopy 10/30/2014  . GERD (gastroesophageal reflux disease) 08/30/2014  . Abdominal pain 08/30/2014  . Constipation 08/30/2014    Past Surgical History:  Procedure Laterality Date  . APPENDECTOMY    . BIOPSY N/A 11/22/2014   Procedure: BIOPSY;  Surgeon: Daneil Dolin, MD;  Location: [facility];  Service: Endoscopy;  Laterality: N/A;  gastric,   . COLONOSCOPY WITH PROPOFOL N/A 11/22/2014   RMR: single colonic polyp removed, tubular adenoma  . ESOPHAGOGASTRODUODENOSCOPY (EGD) WITH PROPOFOL N/A 11/22/2014   Dr. Rourk:abnormal  gastric mucosa of doubtful clinical significance status post biopsy. mild chronic gastritis.   Marland Kitchen POLYPECTOMY  N/A 11/22/2014   Procedure: POLYPECTOMY;  Surgeon: Daneil Dolin, MD;  Location: [facility];  Service: Endoscopy;  Laterality: N/A;  ascending colon         Home Medications    Prior to Admission medications   Medication Sig Start Date End Date Taking? Authorizing Provider  ALPRAZolam (XANAX PO) Take by mouth. 1 at night    [provider]  amitriptyline (ELAVIL) 10 MG tablet Take 10 mg by mouth at bedtime.    [provider]  Calcium Carbonate Antacid (ANTACID PO) Take by mouth. 2 tsps 1-2 times per week    [provider]  Cholecalciferol (VITAMIN D) 2000 units CAPS Take 1 capsule by mouth daily.    [provider]  DEXILANT 60 MG capsule TAKE 1 CAPSULE(60 MG) BY MOUTH DAILY 03/02/17   Annitta Needs, NP  docusate sodium (COLACE) 100 MG capsule Take 100 mg by mouth daily as needed for mild constipation.    [provider]  lisinopril (PRINIVIL,ZESTRIL) 10 MG tablet Take 10 mg by mouth daily. 08/07/14   [provider]  magnesium citrate SOLN Take 1 Bottle by mouth as needed for severe constipation.    [provider]  Multiple Vitamin (ONE-A-DAY MENS PO) Take 1 tablet by mouth daily.    [provider]  Potassium (POTASSIMIN PO) Take 1 tablet by mouth daily.    [provider]  sucralfate (CARAFATE) 1 g tablet Take 1  tablet (1 g total) by mouth 4 (four) times daily -  with meals and at bedtime. 07/04/17   Julianne Rice, MD    Family History Family History  Problem Relation Age of Onset  . Hypertension Mother   . Hypertension Father   . CAD Father        Died age 70  . Colon cancer Neg Hx     Social History Social History   Tobacco Use  . Smoking status: Former Smoker    Packs/day: 1.00    Years: 15.00    Pack years: 15.00    Types: Cigarettes    Last attempt to quit: 11/15/2002    Years since quitting: 14.6  . Smokeless tobacco: Never Used  . Tobacco comment: Quit x 12 years  Substance Use  Topics  . Alcohol use: No    Alcohol/week: 0.0 oz  . Drug use: No     Allergies   Hydrocodone   Review of Systems Review of Systems  Constitutional: Positive for appetite change. Negative for chills, fatigue and fever.  HENT: Positive for sore throat. Negative for trouble swallowing.   Respiratory: Negative for cough and shortness of breath.   Cardiovascular: Negative for chest pain.  Gastrointestinal: Positive for abdominal pain, anal bleeding, constipation and rectal pain. Negative for nausea and vomiting.  Genitourinary: Negative for dysuria, flank pain, frequency and hematuria.  Musculoskeletal: Negative for back pain and myalgias.  Skin: Negative for rash and wound.  Neurological: Negative for dizziness, weakness, light-headedness, numbness and headaches.  All other systems reviewed and are negative.    Physical Exam Updated Vital Signs BP (!) 129/98 (BP Location: Right Arm)   Pulse 99   Temp 98.6 F (37 C) (Oral)   Resp 18   Ht 5\' 9"  (1.753 m)   Wt 60.8 kg (134 lb)   SpO2 100%   BMI 19.79 kg/m   Physical Exam  Constitutional: He is oriented to person, place, and time. He appears well-developed and well-nourished. No distress.  HENT:  Head: Normocephalic and atraumatic.  Mouth/Throat: Oropharynx is clear and moist. No oropharyngeal exudate.  Oropharynx is mildly erythematous.  No tonsillar hypertrophy or exudates.  Uvula is midline.  Eyes: Pupils are equal, round, and reactive to light. EOM are normal.  Neck: Normal range of motion. Neck supple.  Cardiovascular: Normal rate and regular rhythm.  Pulmonary/Chest: Effort normal and breath sounds normal. No stridor. No respiratory distress. He has no wheezes. He has no rales. He exhibits no tenderness.  Abdominal: Soft. Bowel sounds are normal. He exhibits no distension. There is no tenderness. There is no rebound and no guarding.  Reducible external hemorrhoid.  No evidence of thrombosis.  No gross blood fissures  appreciated.  No tenderness or obvious abscesses.  Musculoskeletal: Normal range of motion. He exhibits no edema or tenderness.  No CVA tenderness.  Neurological: He is alert and oriented to person, place, and time.  Moves all extremities without deficit.  Sensation fully intact.  Skin: Skin is warm and dry. Capillary refill takes less than 2 seconds. No rash noted. He is not diaphoretic. No erythema.  Psychiatric: He has a normal mood and affect. His behavior is normal.  Nursing note and vitals reviewed.    ED Treatments / Results  Labs (all labs ordered are listed, but only abnormal results are displayed) Labs Reviewed  COMPREHENSIVE METABOLIC PANEL - Abnormal; Notable for the following components:      Result Value   Chloride 100 (*)  Glucose, Bld 131 (*)    Total Protein 8.2 (*)    AST 42 (*)    All other components within normal limits  CBC WITH DIFFERENTIAL/PLATELET  LIPASE, BLOOD    EKG None  Radiology Dg Abdomen 1 View  Result Date: 07/04/2017 CLINICAL DATA:  Lower abdominal pain with hemorrhoids. EXAM: ABDOMEN - 1 VIEW COMPARISON:  CT abdomen and pelvis August 22, 2014 FINDINGS: There is moderate stool throughout colon. There is no bowel dilatation or air-fluid level to suggest bowel obstruction. No free air. Multiple conglomerated calcifications ranging in size from 3 mm to as large as 9 mm are noted in the right kidney. There are phleboliths in the pelvis. Lung bases clear. IMPRESSION: Bowel gas pattern unremarkable. No bowel obstruction or free air. Moderate stool in colon. Multiple conglomerated right renal calculi. Lung bases clear. Electronically Signed   By: Lowella Grip III M.D.   On: 07/04/2017 16:46    Procedures Procedures (including critical care time)  Medications Ordered in ED Medications  gi cocktail (Maalox,Lidocaine,Donnatal) (30 mLs Oral Given 07/04/17 1618)     Initial Impression / Assessment and Plan / ED Course  I have reviewed the triage  vital signs and the nursing notes.  Pertinent labs & imaging results that were available during my care of the patient were reviewed by me and considered in my medical decision making (see chart for details).     States abdominal discomfort is improved after GI cocktail.  Hemoglobin is stable.  Patient does have evidence of external hemorrhoid that is easily reduced.  No evidence of thrombosis. Advised to take Carafate 4 times daily and continue Dexilant.  Patient also encouraged to make an appointment to follow-up closely with his gastroneurologist.  Advised to continue Colace as needed for constipation.  Return precautions have been given. Final Clinical Impressions(s) / ED Diagnoses   Final diagnoses:  External hemorrhoid  Gastroesophageal reflux disease with esophagitis    ED Discharge Orders        Ordered    sucralfate (CARAFATE) 1 g tablet  3 times daily with meals & bedtime     07/04/17 1737       Julianne Rice, MD 07/04/17 1739

## 2017-07-04 NOTE — ED Triage Notes (Addendum)
Patient c/o hemorrhoids x1.5 weeks. Per patient using cream, suppositories, and wipes with no relief. Denies any blood in stools. Patient using donut to sit due to discomfort. Per family starting to cause patient anxiety and acid reflux.

## 2017-07-07 ENCOUNTER — Ambulatory Visit (INDEPENDENT_AMBULATORY_CARE_PROVIDER_SITE_OTHER): Payer: BLUE CROSS/BLUE SHIELD | Admitting: Nurse Practitioner

## 2017-07-07 ENCOUNTER — Encounter: Payer: Self-pay | Admitting: Nurse Practitioner

## 2017-07-07 VITALS — BP 123/78 | HR 104 | Temp 97.6°F | Ht 69.0 in | Wt 136.2 lb

## 2017-07-07 DIAGNOSIS — K5900 Constipation, unspecified: Secondary | ICD-10-CM

## 2017-07-07 DIAGNOSIS — K649 Unspecified hemorrhoids: Secondary | ICD-10-CM | POA: Diagnosis not present

## 2017-07-07 DIAGNOSIS — K219 Gastro-esophageal reflux disease without esophagitis: Secondary | ICD-10-CM | POA: Diagnosis not present

## 2017-07-07 NOTE — Assessment & Plan Note (Signed)
Noted worsening constipation which caused a flare and hemorrhoid symptoms.  He has been started on Colace and his constipation is much improved.  Recommend he continue this, follow-up in 1 year as previously planned.

## 2017-07-07 NOTE — Patient Instructions (Signed)
1. Continue taking your current medications.  Specifically, take Colace 1-2 times a day, suppositories and rectal creams as needed for hemorrhoids, Dexilant daily, Carafate up to 4 times a day). 2. Return for follow-up in 1 year. 3. Call us if you have any worsening symptoms. 4. Call us if you have any questions or concerns.   It was great to see you today!  I hope you have a wonderful summer!!!     At [facility] we value your feedback. You may receive a survey about your visit today. Please share your experience as we strive to create trusting relationships with our patients to provide genuine, compassionate, quality care.

## 2017-07-07 NOTE — Progress Notes (Signed)
cc'ed to pcp °

## 2017-07-07 NOTE — Progress Notes (Signed)
Referring Provider: Redmond School, MD Primary Care Physician:  Redmond School, MD Primary GI:  Dr. Gala Romney  Chief Complaint  Patient presents with  . Hemorrhoids    f/u. Improved still using creams/suppositories  . Gastroesophageal Reflux    f/u. Improving. Seen in ED on 07/04/17.  . Constipation    HPI:   Daniel Atkinson is a 52 y.o. male who presents for follow-up on GERD and hemorrhoids.  The patient was last seen in our office 06/08/2017 for GERD and history of colon polyps.  Colonoscopy up-to-date and next due 2021.  At last office visit noted 20 pound weight loss.  Reflux well controlled on Dexilant with occasional breakthrough which is resolved with Carafate.  Recommended continued Dexilant and Carafate, colonoscopy in 2021, follow-up in 1 year.  Patient was in the emergency department 07/04/2017 for hemorrhoids and GERD.  At that time on presentation the patient complained of constipation recently and development of itching hemorrhoids a week and a half prior.  Hemorrhoid cream and suppositories help but they returned with a bowel movement.  Started a laxative but having loose bowel movements on that.  Worsening reflux.  Scant toilet tissue hematochezia.  Exam reducible external hemorrhoid was found with no evidence of thrombosis or gross blood/fissures.  Labs were essentially normal.  Symptoms improved after GI cocktail.  Recommended continue Dexilant and Carafate 4 times daily.  Continue Colace for constipation.  Today he states he's doing better. Had been losing weight when trying to come off Carafate and this caused worsening symptoms and decreased appetite. Back on Carafate 4 times a day and symptoms are back to normal. Still on Colace twice daily, constipation is improved now. Hemorrhoids have improved with this, although they occasionally do flare but rectal topical therapy helps at these times. Denies abdominal pain, N/V, hematochezia, melena, fever, chills, unintentional  weight loss. Denies chest pain, dyspnea, dizziness, lightheadedness, syncope, near syncope. Denies any other upper or lower GI symptoms.  Past Medical History:  Diagnosis Date  . Anxiety   . Essential hypertension   . GERD (gastroesophageal reflux disease)   . Headache   . Hiatal hernia     Past Surgical History:  Procedure Laterality Date  . APPENDECTOMY    . BIOPSY N/A 11/22/2014   Procedure: BIOPSY;  Surgeon: Daneil Dolin, MD;  Location: [facility];  Service: Endoscopy;  Laterality: N/A;  gastric,   . COLONOSCOPY WITH PROPOFOL N/A 11/22/2014   RMR: single colonic polyp removed, tubular adenoma  . ESOPHAGOGASTRODUODENOSCOPY (EGD) WITH PROPOFOL N/A 11/22/2014   Dr. Rourk:abnormal  gastric mucosa of doubtful clinical significance status post biopsy. mild chronic gastritis.   Marland Kitchen POLYPECTOMY N/A 11/22/2014   Procedure: POLYPECTOMY;  Surgeon: Daneil Dolin, MD;  Location: [facility];  Service: Endoscopy;  Laterality: N/A;  ascending colon     Current Outpatient Medications  Medication Sig Dispense Refill  . ALPRAZolam (XANAX PO) Take by mouth. 1 at night    . amitriptyline (ELAVIL) 10 MG tablet Take 10 mg by mouth at bedtime.    . Calcium Carbonate Antacid (ANTACID PO) Take by mouth. 2 tsps 1-2 times per week    . Cholecalciferol (VITAMIN D) 2000 units CAPS Take 1 capsule by mouth daily.    Marland Kitchen DEXILANT 60 MG capsule TAKE 1 CAPSULE(60 MG) BY MOUTH DAILY 90 capsule 3  . docusate sodium (COLACE) 100 MG capsule Take 100 mg by mouth daily as needed for mild constipation.    . Lidocaine-Glycerin (PREPARATION H RE)  Place rectally. Using suppositories/cream    . lisinopril (PRINIVIL,ZESTRIL) 10 MG tablet Take 10 mg by mouth daily.  5  . magnesium citrate SOLN Take 1 Bottle by mouth as needed for severe constipation.    . Multiple Vitamin (ONE-A-DAY MENS PO) Take 1 tablet by mouth daily.    . Potassium (POTASSIMIN PO) Take 1 tablet by mouth daily.    . sucralfate (CARAFATE) 1 g tablet Take 1 tablet (1 g  total) by mouth 4 (four) times daily -  with meals and at bedtime. 30 tablet 0   No current facility-administered medications for this visit.     Allergies as of 07/07/2017 - Review Complete 07/07/2017  Allergen Reaction Noted  . Hydrocodone Nausea And Vomiting 08/09/2014    Family History  Problem Relation Age of Onset  . Hypertension Mother   . Hypertension Father   . CAD Father        Died age 59  . Colon cancer Neg Hx     Social History   Socioeconomic History  . Marital status: Married    Spouse name: Not on file  . Number of children: Not on file  . Years of education: Not on file  . Highest education level: Not on file  Occupational History  . Not on file  Social Needs  . Financial resource strain: Not on file  . Food insecurity:    Worry: Not on file    Inability: Not on file  . Transportation needs:    Medical: Not on file    Non-medical: Not on file  Tobacco Use  . Smoking status: Former Smoker    Packs/day: 1.00    Years: 15.00    Pack years: 15.00    Types: Cigarettes    Last attempt to quit: 11/15/2002    Years since quitting: 14.6  . Smokeless tobacco: Never Used  . Tobacco comment: Quit x 12 years  Substance and Sexual Activity  . Alcohol use: No    Alcohol/week: 0.0 oz  . Drug use: No  . Sexual activity: Yes    Birth control/protection: None  Lifestyle  . Physical activity:    Days per week: Not on file    Minutes per session: Not on file  . Stress: Not on file  Relationships  . Social connections:    Talks on phone: Not on file    Gets together: Not on file    Attends religious service: Not on file    Active member of club or organization: Not on file    Attends meetings of clubs or organizations: Not on file    Relationship status: Not on file  Other Topics Concern  . Not on file  Social History Narrative  . Not on file    Review of Systems: General: Negative for anorexia, weight loss, fever, chills, fatigue, weakness. ENT:  Negative for hoarseness, difficulty swallowing , nasal congestion. CV: Negative for chest pain, angina, palpitations, dyspnea on exertion, peripheral edema.  Respiratory: Negative for dyspnea at rest, dyspnea on exertion, cough, sputum, wheezing.  GI: See history of present illness. GU:  Negative for dysuria, hematuria, urinary incontinence, urinary frequency, nocturnal urination.  Endo: Negative for unusual weight change.  Heme: Negative for bruising or bleeding.   Physical Exam: BP 123/78   Pulse (!) 104   Temp 97.6 F (36.4 C) (Oral)   Ht 5\' 9"  (1.753 m)   Wt 136 lb 3.2 oz (61.8 kg)   BMI 20.11 kg/m  General:  Alert and oriented. Pleasant and cooperative. Well-nourished and well-developed.  Eyes:  Without icterus, sclera clear and conjunctiva pink.  Ears:  Normal auditory acuity. Cardiovascular:  S1, S2 present without murmurs appreciated. Extremities without clubbing or edema. Respiratory:  Clear to auscultation bilaterally. No wheezes, rales, or rhonchi. No distress.  Gastrointestinal:  +BS, soft, non-tender and non-distended. No HSM noted. No guarding or rebound. No masses appreciated.  Rectal:  Deferred  Musculoskalatal:  Symmetrical without gross deformities. Neurologic:  Alert and oriented x4;  grossly normal neurologically. Psych:  Alert and cooperative. Normal mood and affect. Heme/Lymph/Immune: No excessive bruising noted.    07/07/2017 10:06 AM   Disclaimer: This note was dictated with voice recognition software. Similar sounding words can inadvertently be transcribed and may not be corrected upon review.

## 2017-07-07 NOTE — Assessment & Plan Note (Signed)
GERD symptoms that initially worsened when he tried to cut back on his Carafate.  However, now he is back on Dexilant daily and Carafate up to 4 times a day his symptoms are significantly improved.  Recommend he continue this for now.  Follow-up in 1 year as previously recommended.

## 2017-07-07 NOTE — Assessment & Plan Note (Signed)
Worsening hemorrhoids associated with constipation.  Emergency department physical exam noted protruding hemorrhoids.  These have improved with improvement in constipation as well as use of topical therapy.  Recommend he continue topical therapy and Colace as per above.  Follow-up in 1 year as previously planned.

## 2017-07-21 DIAGNOSIS — Z6820 Body mass index (BMI) 20.0-20.9, adult: Secondary | ICD-10-CM | POA: Diagnosis not present

## 2017-07-21 DIAGNOSIS — G4709 Other insomnia: Secondary | ICD-10-CM | POA: Diagnosis not present

## 2017-07-21 DIAGNOSIS — K219 Gastro-esophageal reflux disease without esophagitis: Secondary | ICD-10-CM | POA: Diagnosis not present

## 2017-07-21 DIAGNOSIS — Z1389 Encounter for screening for other disorder: Secondary | ICD-10-CM | POA: Diagnosis not present

## 2017-07-21 DIAGNOSIS — Z0000 Encounter for general adult medical examination without abnormal findings: Secondary | ICD-10-CM | POA: Diagnosis not present

## 2017-08-02 ENCOUNTER — Telehealth: Payer: Self-pay | Admitting: Internal Medicine

## 2017-08-02 MED ORDER — SUCRALFATE 1 G PO TABS: 1.0000 g | ORAL_TABLET | Freq: Three times a day (TID) | ORAL | 1 refills | Status: DC

## 2017-08-02 NOTE — Telephone Encounter (Signed)
Refill done. Further recommendations per EG.

## 2017-08-02 NOTE — Addendum Note (Signed)
Addended by: Mahala Menghini on: 08/02/2017 08:59 PM   Modules accepted: Orders

## 2017-08-02 NOTE — Telephone Encounter (Signed)
PLEASE CALL PATIENT, HAVING STOMACH PAIN, DOES NOT KNOW WHY THEY ARE DELAYING HIS MEDICATION, HERNIA BOTHERING HIM

## 2017-08-02 NOTE — Telephone Encounter (Signed)
Pt notified and will stop by this week for a weight check. Pt is aware that our office closes early on Fridays. Pt also wants wants a refill of his Carafate which is suppose to be taken four times daily. Will send to the refill Box  Please refill Carafate 1mg 

## 2017-08-02 NOTE — Telephone Encounter (Signed)
Spoke with pt and he isn't having stomach pain. He has nausea occasionally and feels his hiatal hernia has been bothering him, after doing pushups. Pt was seen 06/2017 and says he still looses 2-3 lbs a month and he's changed his diet to help gain weight but still loosing. Pt has hemorrhoid flares but are controlled with topical creams.

## 2017-08-02 NOTE — Telephone Encounter (Signed)
Doing pushups (and otherwise straining, particularly with upper body workouts) can increase the pressue in his abdomen and cause his hernia to feel worse. Avoid strenuous activities for now.  Can we have him come in to the office for a weight check please? I'd like to check it objectively compared to last month. Futher recommendations to follow after this.

## 2017-08-03 ENCOUNTER — Telehealth: Payer: Self-pay | Admitting: *Deleted

## 2017-08-03 ENCOUNTER — Other Ambulatory Visit: Payer: Self-pay | Admitting: Gastroenterology

## 2017-08-03 NOTE — Telephone Encounter (Signed)
Patient came in for weight check. His weight was 132.2lbs. Fowarding to EG

## 2017-08-03 NOTE — Telephone Encounter (Signed)
Pt was notified that his RX was sent to his pharmacy.

## 2017-08-31 ENCOUNTER — Telehealth: Payer: Self-pay | Admitting: Internal Medicine

## 2017-08-31 NOTE — Telephone Encounter (Signed)
PATIENT CALLED AND SAID HE THOUGHT WE WERE SUPPOSED TO REFER HIM TO A UROLOGIST BUT HE HAS NOT HEARD ANYTHING FROM [place]

## 2017-08-31 NOTE — Telephone Encounter (Signed)
Reviewed chart. No order for urologist referral. Called pt. His PCP had mentioned he needed to see urologist and he thought our office had to do the referral. Advised pt to contact his PCP for urology referral if needed.

## 2017-09-06 NOTE — Telephone Encounter (Signed)
Noted. This represents an approximate 2-4 lb loss in a month.  Please have him come in this week to recheck weight.

## 2017-09-07 NOTE — Telephone Encounter (Signed)
Patient came in for weight check today. He was 141.4.

## 2017-09-07 NOTE — Telephone Encounter (Signed)
Called patient and he is going to come in this week for recheck sometime. FYI to [employer]

## 2017-11-01 DIAGNOSIS — Z6821 Body mass index (BMI) 21.0-21.9, adult: Secondary | ICD-10-CM | POA: Diagnosis not present

## 2017-11-01 DIAGNOSIS — F419 Anxiety disorder, unspecified: Secondary | ICD-10-CM | POA: Diagnosis not present

## 2017-11-01 DIAGNOSIS — Z1389 Encounter for screening for other disorder: Secondary | ICD-10-CM | POA: Diagnosis not present

## 2017-12-07 ENCOUNTER — Ambulatory Visit (INDEPENDENT_AMBULATORY_CARE_PROVIDER_SITE_OTHER): Payer: BLUE CROSS/BLUE SHIELD | Admitting: Urology

## 2017-12-07 DIAGNOSIS — R972 Elevated prostate specific antigen [PSA]: Secondary | ICD-10-CM | POA: Diagnosis not present

## 2017-12-07 DIAGNOSIS — D2930 Benign neoplasm of unspecified epididymis: Secondary | ICD-10-CM

## 2017-12-17 ENCOUNTER — Other Ambulatory Visit: Payer: Self-pay | Admitting: Urology

## 2017-12-17 DIAGNOSIS — D2930 Benign neoplasm of unspecified epididymis: Secondary | ICD-10-CM

## 2017-12-28 ENCOUNTER — Ambulatory Visit (HOSPITAL_COMMUNITY)
Admission: RE | Admit: 2017-12-28 | Discharge: 2017-12-28 | Disposition: A | Discharge status: Home / Self Care | Payer: BLUE CROSS/BLUE SHIELD | Source: Ambulatory Visit | Attending: Urology | Admitting: Urology

## 2017-12-28 DIAGNOSIS — N433 Hydrocele, unspecified: Secondary | ICD-10-CM | POA: Diagnosis not present

## 2017-12-28 DIAGNOSIS — D2930 Benign neoplasm of unspecified epididymis: Secondary | ICD-10-CM | POA: Diagnosis not present

## 2018-01-17 DIAGNOSIS — R972 Elevated prostate specific antigen [PSA]: Secondary | ICD-10-CM | POA: Diagnosis not present

## 2018-01-25 ENCOUNTER — Ambulatory Visit (INDEPENDENT_AMBULATORY_CARE_PROVIDER_SITE_OTHER): Payer: BLUE CROSS/BLUE SHIELD | Admitting: Urology

## 2018-01-25 DIAGNOSIS — D2930 Benign neoplasm of unspecified epididymis: Secondary | ICD-10-CM

## 2018-01-25 DIAGNOSIS — N200 Calculus of kidney: Secondary | ICD-10-CM | POA: Diagnosis not present

## 2018-01-25 DIAGNOSIS — R972 Elevated prostate specific antigen [PSA]: Secondary | ICD-10-CM | POA: Diagnosis not present

## 2018-01-28 ENCOUNTER — Other Ambulatory Visit: Payer: Self-pay | Admitting: Urology

## 2018-01-28 DIAGNOSIS — R972 Elevated prostate specific antigen [PSA]: Secondary | ICD-10-CM

## 2018-02-22 ENCOUNTER — Ambulatory Visit (HOSPITAL_COMMUNITY)
Admission: RE | Admit: 2018-02-22 | Discharge: 2018-02-22 | Disposition: A | Discharge status: Home / Self Care | Payer: BLUE CROSS/BLUE SHIELD | Source: Ambulatory Visit | Attending: Urology | Admitting: Urology

## 2018-02-22 DIAGNOSIS — C61 Malignant neoplasm of prostate: Secondary | ICD-10-CM | POA: Diagnosis not present

## 2018-02-22 DIAGNOSIS — R972 Elevated prostate specific antigen [PSA]: Secondary | ICD-10-CM | POA: Diagnosis not present

## 2018-02-22 MED ORDER — LIDOCAINE HCL (PF) 2 % IJ SOLN
INTRAMUSCULAR | Status: AC
  Administered 2018-02-22: 10 mL
  Filled 2018-02-22: qty 10

## 2018-02-22 MED ORDER — GENTAMICIN SULFATE 40 MG/ML IJ SOLN
160.0000 mg | Freq: Once | INTRAMUSCULAR | Status: AC
  Administered 2018-02-22: 160 mg via INTRAMUSCULAR

## 2018-02-22 MED ORDER — GENTAMICIN SULFATE 40 MG/ML IJ SOLN
INTRAMUSCULAR | Status: AC
  Administered 2018-02-22: 160 mg via INTRAMUSCULAR
  Filled 2018-02-22: qty 4

## 2018-02-22 MED ORDER — LIDOCAINE HCL (PF) 2 % IJ SOLN
10.0000 mL | Freq: Once | INTRAMUSCULAR | Status: AC
  Administered 2018-02-22: 10 mL

## 2018-02-23 ENCOUNTER — Other Ambulatory Visit: Payer: Self-pay

## 2018-02-23 ENCOUNTER — Telehealth: Payer: Self-pay

## 2018-02-23 MED ORDER — DEXLANSOPRAZOLE 60 MG PO CPDR: DELAYED_RELEASE_CAPSULE | ORAL | 3 refills | Status: DC

## 2018-02-23 NOTE — Telephone Encounter (Signed)
PT walked in office for [place] samples. He said [facility] will contact our office about a refill of his medication.

## 2018-03-01 NOTE — Telephone Encounter (Signed)
PA WAS SUBMITTED THROUGH [email or web address]. NO PA IS NEEDED. CALLED THE PHARMACY, PT HAS PICKED HIS MEDICATION UP.

## 2018-03-02 ENCOUNTER — Other Ambulatory Visit: Payer: Self-pay | Admitting: Gastroenterology

## 2018-04-01 DIAGNOSIS — H4010X2 Unspecified open-angle glaucoma, moderate stage: Secondary | ICD-10-CM | POA: Diagnosis not present

## 2018-04-06 DIAGNOSIS — C61 Malignant neoplasm of prostate: Secondary | ICD-10-CM | POA: Diagnosis not present

## 2018-04-19 DIAGNOSIS — C61 Malignant neoplasm of prostate: Secondary | ICD-10-CM | POA: Diagnosis not present

## 2018-04-26 DIAGNOSIS — C61 Malignant neoplasm of prostate: Secondary | ICD-10-CM | POA: Diagnosis not present

## 2018-05-23 DIAGNOSIS — C61 Malignant neoplasm of prostate: Secondary | ICD-10-CM | POA: Diagnosis not present

## 2018-05-30 DIAGNOSIS — C61 Malignant neoplasm of prostate: Secondary | ICD-10-CM | POA: Diagnosis not present

## 2018-06-02 DIAGNOSIS — C61 Malignant neoplasm of prostate: Secondary | ICD-10-CM | POA: Diagnosis not present

## 2018-06-08 DIAGNOSIS — C61 Malignant neoplasm of prostate: Secondary | ICD-10-CM | POA: Diagnosis not present

## 2018-06-09 ENCOUNTER — Encounter: Payer: Self-pay | Admitting: Internal Medicine

## 2018-06-09 DIAGNOSIS — C61 Malignant neoplasm of prostate: Secondary | ICD-10-CM | POA: Diagnosis not present

## 2018-06-10 DIAGNOSIS — C61 Malignant neoplasm of prostate: Secondary | ICD-10-CM | POA: Diagnosis not present

## 2018-06-13 DIAGNOSIS — C61 Malignant neoplasm of prostate: Secondary | ICD-10-CM | POA: Diagnosis not present

## 2018-06-14 DIAGNOSIS — C61 Malignant neoplasm of prostate: Secondary | ICD-10-CM | POA: Diagnosis not present

## 2018-06-15 DIAGNOSIS — C61 Malignant neoplasm of prostate: Secondary | ICD-10-CM | POA: Diagnosis not present

## 2018-06-16 DIAGNOSIS — C61 Malignant neoplasm of prostate: Secondary | ICD-10-CM | POA: Diagnosis not present

## 2018-06-17 DIAGNOSIS — C61 Malignant neoplasm of prostate: Secondary | ICD-10-CM | POA: Diagnosis not present

## 2018-06-20 DIAGNOSIS — C61 Malignant neoplasm of prostate: Secondary | ICD-10-CM | POA: Diagnosis not present

## 2018-06-21 DIAGNOSIS — C61 Malignant neoplasm of prostate: Secondary | ICD-10-CM | POA: Diagnosis not present

## 2018-06-22 DIAGNOSIS — C61 Malignant neoplasm of prostate: Secondary | ICD-10-CM | POA: Diagnosis not present

## 2018-06-23 DIAGNOSIS — C61 Malignant neoplasm of prostate: Secondary | ICD-10-CM | POA: Diagnosis not present

## 2018-06-24 DIAGNOSIS — C61 Malignant neoplasm of prostate: Secondary | ICD-10-CM | POA: Diagnosis not present

## 2018-06-27 DIAGNOSIS — C61 Malignant neoplasm of prostate: Secondary | ICD-10-CM | POA: Diagnosis not present

## 2018-06-28 DIAGNOSIS — C61 Malignant neoplasm of prostate: Secondary | ICD-10-CM | POA: Diagnosis not present

## 2018-06-29 DIAGNOSIS — C61 Malignant neoplasm of prostate: Secondary | ICD-10-CM | POA: Diagnosis not present

## 2018-06-30 DIAGNOSIS — C61 Malignant neoplasm of prostate: Secondary | ICD-10-CM | POA: Diagnosis not present

## 2018-07-04 DIAGNOSIS — C61 Malignant neoplasm of prostate: Secondary | ICD-10-CM | POA: Diagnosis not present

## 2018-07-05 DIAGNOSIS — C61 Malignant neoplasm of prostate: Secondary | ICD-10-CM | POA: Diagnosis not present

## 2018-07-07 DIAGNOSIS — C61 Malignant neoplasm of prostate: Secondary | ICD-10-CM | POA: Diagnosis not present

## 2018-07-08 DIAGNOSIS — C61 Malignant neoplasm of prostate: Secondary | ICD-10-CM | POA: Diagnosis not present

## 2018-07-11 DIAGNOSIS — C61 Malignant neoplasm of prostate: Secondary | ICD-10-CM | POA: Diagnosis not present

## 2018-07-12 DIAGNOSIS — C61 Malignant neoplasm of prostate: Secondary | ICD-10-CM | POA: Diagnosis not present

## 2018-07-13 DIAGNOSIS — C61 Malignant neoplasm of prostate: Secondary | ICD-10-CM | POA: Diagnosis not present

## 2018-07-14 DIAGNOSIS — C61 Malignant neoplasm of prostate: Secondary | ICD-10-CM | POA: Diagnosis not present

## 2018-07-15 DIAGNOSIS — C61 Malignant neoplasm of prostate: Secondary | ICD-10-CM | POA: Diagnosis not present

## 2018-07-18 DIAGNOSIS — C61 Malignant neoplasm of prostate: Secondary | ICD-10-CM | POA: Diagnosis not present

## 2018-07-19 DIAGNOSIS — C61 Malignant neoplasm of prostate: Secondary | ICD-10-CM | POA: Diagnosis not present

## 2018-07-20 DIAGNOSIS — C61 Malignant neoplasm of prostate: Secondary | ICD-10-CM | POA: Diagnosis not present

## 2018-07-21 DIAGNOSIS — C61 Malignant neoplasm of prostate: Secondary | ICD-10-CM | POA: Diagnosis not present

## 2018-07-22 DIAGNOSIS — C61 Malignant neoplasm of prostate: Secondary | ICD-10-CM | POA: Diagnosis not present

## 2018-07-25 DIAGNOSIS — C61 Malignant neoplasm of prostate: Secondary | ICD-10-CM | POA: Diagnosis not present

## 2018-07-26 DIAGNOSIS — C61 Malignant neoplasm of prostate: Secondary | ICD-10-CM | POA: Diagnosis not present

## 2018-07-27 DIAGNOSIS — C61 Malignant neoplasm of prostate: Secondary | ICD-10-CM | POA: Diagnosis not present

## 2018-07-28 DIAGNOSIS — C61 Malignant neoplasm of prostate: Secondary | ICD-10-CM | POA: Diagnosis not present

## 2018-07-29 DIAGNOSIS — C61 Malignant neoplasm of prostate: Secondary | ICD-10-CM | POA: Diagnosis not present

## 2018-08-01 DIAGNOSIS — C61 Malignant neoplasm of prostate: Secondary | ICD-10-CM | POA: Diagnosis not present

## 2018-08-02 DIAGNOSIS — C61 Malignant neoplasm of prostate: Secondary | ICD-10-CM | POA: Diagnosis not present

## 2018-08-03 DIAGNOSIS — C61 Malignant neoplasm of prostate: Secondary | ICD-10-CM | POA: Diagnosis not present

## 2018-08-04 DIAGNOSIS — C61 Malignant neoplasm of prostate: Secondary | ICD-10-CM | POA: Diagnosis not present

## 2018-08-05 DIAGNOSIS — C61 Malignant neoplasm of prostate: Secondary | ICD-10-CM | POA: Diagnosis not present

## 2018-08-08 DIAGNOSIS — C61 Malignant neoplasm of prostate: Secondary | ICD-10-CM | POA: Diagnosis not present

## 2018-08-09 DIAGNOSIS — C61 Malignant neoplasm of prostate: Secondary | ICD-10-CM | POA: Diagnosis not present

## 2018-08-10 DIAGNOSIS — C61 Malignant neoplasm of prostate: Secondary | ICD-10-CM | POA: Diagnosis not present

## 2018-08-23 ENCOUNTER — Ambulatory Visit (INDEPENDENT_AMBULATORY_CARE_PROVIDER_SITE_OTHER): Payer: BLUE CROSS/BLUE SHIELD | Admitting: Urology

## 2018-08-23 DIAGNOSIS — C61 Malignant neoplasm of prostate: Secondary | ICD-10-CM | POA: Diagnosis not present

## 2018-08-30 DIAGNOSIS — Z1389 Encounter for screening for other disorder: Secondary | ICD-10-CM | POA: Diagnosis not present

## 2018-08-30 DIAGNOSIS — Z0000 Encounter for general adult medical examination without abnormal findings: Secondary | ICD-10-CM | POA: Diagnosis not present

## 2018-08-30 DIAGNOSIS — Z6823 Body mass index (BMI) 23.0-23.9, adult: Secondary | ICD-10-CM | POA: Diagnosis not present

## 2018-09-14 DIAGNOSIS — C61 Malignant neoplasm of prostate: Secondary | ICD-10-CM | POA: Diagnosis not present

## 2018-09-19 ENCOUNTER — Other Ambulatory Visit: Payer: Self-pay

## 2018-09-19 ENCOUNTER — Encounter: Payer: Self-pay | Admitting: Gastroenterology

## 2018-09-19 ENCOUNTER — Ambulatory Visit: Payer: BLUE CROSS/BLUE SHIELD | Admitting: Gastroenterology

## 2018-09-19 VITALS — BP 145/90 | HR 84 | Temp 97.2°F | Ht 69.0 in | Wt 157.4 lb

## 2018-09-19 DIAGNOSIS — K5900 Constipation, unspecified: Secondary | ICD-10-CM

## 2018-09-19 DIAGNOSIS — K219 Gastro-esophageal reflux disease without esophagitis: Secondary | ICD-10-CM

## 2018-09-19 NOTE — Assessment & Plan Note (Signed)
Stool softener as needed. No concerning lower GI signs/symptoms. Return in 1 year to schedule surveillance colonoscopy.

## 2018-09-19 NOTE — Patient Instructions (Signed)
So good to see you again!  Continue with Dexilant daily. Continue to titrate down carafate as you are able.  We will see you in 1 year and arrange a colonoscopy at that time!  I enjoyed seeing you again today! As you know, I value our relationship and want to provide genuine, compassionate, and quality care. I welcome your feedback. If you receive a survey regarding your visit,  I greatly appreciate you taking time to fill this out. See you next time!  Annitta Needs, PhD, ANP-BC [facility]

## 2018-09-19 NOTE — Assessment & Plan Note (Signed)
Controlled well with Dexilant. Continue to taper off carafate as able. Return in 1 year,.

## 2018-09-19 NOTE — Progress Notes (Signed)
Referring Provider: Redmond School, MD Primary Care Physician:  Redmond School, MD Primary GI: Dr. Gala Romney   Chief Complaint  Patient presents with  . Gastroesophageal Reflux    doing better since prostate tx    HPI:   Daniel Atkinson is a 53 y.o. male presenting today with a history of GERD, adenomas with next colonoscopy in 2021, here for one year follow-up.   Takes stool softener every 2-3 days. No Miralax needed. NO abdominal pain. No rectal bleeding. He has decreased carafate to BID. Has done well with this historically. Taking Dexilant daily. Good appetite. Feels much better since last seen. Weight increased.   Past Medical History:  Diagnosis Date  . Anxiety   . Essential hypertension   . GERD (gastroesophageal reflux disease)   . Headache   . Hiatal hernia     Past Surgical History:  Procedure Laterality Date  . APPENDECTOMY    . BIOPSY N/A 11/22/2014   Procedure: BIOPSY;  Surgeon: Daneil Dolin, MD;  Location: [facility];  Service: Endoscopy;  Laterality: N/A;  gastric,   . COLONOSCOPY WITH PROPOFOL N/A 11/22/2014   RMR: single colonic polyp removed, tubular adenoma  . ESOPHAGOGASTRODUODENOSCOPY (EGD) WITH PROPOFOL N/A 11/22/2014   Dr. Rourk:abnormal  gastric mucosa of doubtful clinical significance status post biopsy. mild chronic gastritis.   Marland Kitchen POLYPECTOMY N/A 11/22/2014   Procedure: POLYPECTOMY;  Surgeon: Daneil Dolin, MD;  Location: [facility];  Service: Endoscopy;  Laterality: N/A;  ascending colon     Current Outpatient Medications  Medication Sig Dispense Refill  . ALPRAZolam (XANAX PO) Take by mouth. 1 at night    . Calcium Carbonate Antacid (ANTACID PO) Take by mouth. 2 tsps 1-2 times per week    . Cholecalciferol (VITAMIN D) 2000 units CAPS Take 1 capsule by mouth daily.    Marland Kitchen dexlansoprazole (DEXILANT) 60 MG capsule TAKE 1 CAPSULE(60 MG) BY MOUTH DAILY 90 capsule 3  . docusate sodium (COLACE) 100 MG capsule Take 100 mg by mouth daily as needed for mild  constipation.    Marland Kitchen lisinopril (PRINIVIL,ZESTRIL) 10 MG tablet Take 10 mg by mouth daily.  5  . Multiple Vitamin (ONE-A-DAY MENS PO) Take 1 tablet by mouth daily.    Marland Kitchen OVER THE COUNTER MEDICATION Vitamin C/Vitamin D packet in water usually daily    . sucralfate (CARAFATE) 1 g tablet TAKE 1 TABLET BY MOUTH FOUR TIMES DAILY BEFORE MEALS AND AT BEDTIME AS NEEDED FOR REFLUX (Patient taking differently: TAKE 1 TABLET BY MOUTH FOUR TIMES DAILY BEFORE MEALS AND AT BEDTIME AS NEEDED FOR REFLUX. Usually takes 1 tablet twice a day.) 360 tablet 0  . tamsulosin (FLOMAX) 0.4 MG CAPS capsule Take 1 capsule by mouth daily.     No current facility-administered medications for this visit.     Allergies as of 09/19/2018 - Review Complete 09/19/2018  Allergen Reaction Noted  . Hydrocodone Nausea And Vomiting 08/09/2014    Family History  Problem Relation Age of Onset  . Hypertension Mother   . Hypertension Father   . CAD Father        Died age 21  . Colon cancer Neg Hx     Social History   Socioeconomic History  . Marital status: Married    Spouse name: Not on file  . Number of children: Not on file  . Years of education: Not on file  . Highest education level: Not on file  Occupational History  . Not on file  Social Needs  . Financial resource strain: Not on file  . Food insecurity    Worry: Not on file    Inability: Not on file  . Transportation needs    Medical: Not on file    Non-medical: Not on file  Tobacco Use  . Smoking status: Former Smoker    Packs/day: 1.00    Years: 15.00    Pack years: 15.00    Types: Cigarettes    Quit date: 11/15/2002    Years since quitting: 15.8  . Smokeless tobacco: Never Used  . Tobacco comment: Quit x 12 years  Substance and Sexual Activity  . Alcohol use: No    Alcohol/week: 0.0 standard drinks  . Drug use: No  . Sexual activity: Yes    Birth control/protection: None  Lifestyle  . Physical activity    Days per week: Not on file    Minutes  per session: Not on file  . Stress: Not on file  Relationships  . Social Herbalist on phone: Not on file    Gets together: Not on file    Attends religious service: Not on file    Active member of club or organization: Not on file    Attends meetings of clubs or organizations: Not on file    Relationship status: Not on file  Other Topics Concern  . Not on file  Social History Narrative  . Not on file    Review of Systems: Gen: Denies fever, chills, anorexia. Denies fatigue, weakness, weight loss.  CV: Denies chest pain, palpitations, syncope, peripheral edema, and claudication. Resp: Denies dyspnea at rest, cough, wheezing, coughing up blood, and pleurisy. GI: see HPI Derm: Denies rash, itching, dry skin Psych: Denies depression, anxiety, memory loss, confusion. No homicidal or suicidal ideation.  Heme: Denies bruising, bleeding, and enlarged lymph nodes.  Physical Exam: BP (!) 145/90   Pulse 84   Temp (!) 97.2 F (36.2 C) (Oral)   Ht 5\' 9"  (1.753 m)   Wt 157 lb 6.4 oz (71.4 kg)   BMI 23.24 kg/m  General:   Alert and oriented. No distress noted. Pleasant and cooperative.  Head:  Normocephalic and atraumatic. Eyes:  Conjuctiva clear without scleral icterus. Mouth:  Oral mucosa pink and moist.  Lungs:clear to auscultation bilaterally Cardiac: S1 S2 present without murmurs  Abdomen:  +BS, soft, non-tender and non-distended. No rebound or guarding. No HSM or masses noted. Msk:  Symmetrical without gross deformities. Normal posture. Extremities:  Without edema. Neurologic:  Alert and  oriented x4 Psych:  Alert and cooperative. Normal mood and affect.

## 2018-09-20 NOTE — Progress Notes (Signed)
CC'D TO PCP °

## 2018-10-25 DIAGNOSIS — H35033 Hypertensive retinopathy, bilateral: Secondary | ICD-10-CM | POA: Diagnosis not present

## 2018-11-22 DIAGNOSIS — H3562 Retinal hemorrhage, left eye: Secondary | ICD-10-CM | POA: Diagnosis not present

## 2018-12-27 ENCOUNTER — Ambulatory Visit (INDEPENDENT_AMBULATORY_CARE_PROVIDER_SITE_OTHER): Payer: BC Managed Care – PPO | Admitting: Urology

## 2018-12-27 DIAGNOSIS — C61 Malignant neoplasm of prostate: Secondary | ICD-10-CM | POA: Diagnosis not present

## 2019-01-18 DIAGNOSIS — C61 Malignant neoplasm of prostate: Secondary | ICD-10-CM | POA: Diagnosis not present

## 2019-02-11 ENCOUNTER — Other Ambulatory Visit: Payer: Self-pay | Admitting: Gastroenterology

## 2019-02-14 ENCOUNTER — Telehealth: Payer: Self-pay | Admitting: Internal Medicine

## 2019-02-14 MED ORDER — DEXILANT 60 MG PO CPDR: DELAYED_RELEASE_CAPSULE | ORAL | 3 refills | Status: DC

## 2019-02-14 NOTE — Telephone Encounter (Signed)
Patient came by for dexlinat samples, he said his pharmacy was faxing [place] his refill for that medication

## 2019-02-14 NOTE — Telephone Encounter (Signed)
Routing to [facility] Refill box. Pt is requesting a refill for Dexilant 60 mg daily.

## 2019-02-14 NOTE — Addendum Note (Signed)
Addended by: Mahala Menghini on: 02/14/2019 12:16 PM   Modules accepted: Orders

## 2019-03-13 ENCOUNTER — Other Ambulatory Visit: Payer: Self-pay

## 2019-03-13 DIAGNOSIS — C61 Malignant neoplasm of prostate: Secondary | ICD-10-CM

## 2019-04-10 ENCOUNTER — Encounter: Payer: Self-pay | Admitting: Urology

## 2019-04-11 DIAGNOSIS — Z6822 Body mass index (BMI) 22.0-22.9, adult: Secondary | ICD-10-CM | POA: Diagnosis not present

## 2019-04-11 DIAGNOSIS — K219 Gastro-esophageal reflux disease without esophagitis: Secondary | ICD-10-CM | POA: Diagnosis not present

## 2019-04-11 DIAGNOSIS — F419 Anxiety disorder, unspecified: Secondary | ICD-10-CM | POA: Diagnosis not present

## 2019-05-31 DIAGNOSIS — C61 Malignant neoplasm of prostate: Secondary | ICD-10-CM | POA: Diagnosis not present

## 2019-05-31 DIAGNOSIS — R6882 Decreased libido: Secondary | ICD-10-CM | POA: Diagnosis not present

## 2019-06-14 ENCOUNTER — Emergency Department (HOSPITAL_COMMUNITY): Payer: BC Managed Care – PPO

## 2019-06-14 ENCOUNTER — Other Ambulatory Visit: Payer: Self-pay

## 2019-06-14 ENCOUNTER — Encounter (HOSPITAL_COMMUNITY): Payer: Self-pay | Admitting: *Deleted

## 2019-06-14 ENCOUNTER — Emergency Department (HOSPITAL_COMMUNITY)
Admission: EM | Admit: 2019-06-14 | Discharge: 2019-06-14 | Disposition: A | Discharge status: Home / Self Care | Payer: BC Managed Care – PPO | Attending: Emergency Medicine | Admitting: Emergency Medicine

## 2019-06-14 DIAGNOSIS — Z20822 Contact with and (suspected) exposure to covid-19: Secondary | ICD-10-CM | POA: Diagnosis not present

## 2019-06-14 DIAGNOSIS — I10 Essential (primary) hypertension: Secondary | ICD-10-CM | POA: Diagnosis not present

## 2019-06-14 DIAGNOSIS — R17 Unspecified jaundice: Secondary | ICD-10-CM | POA: Diagnosis not present

## 2019-06-14 DIAGNOSIS — R05 Cough: Secondary | ICD-10-CM | POA: Insufficient documentation

## 2019-06-14 DIAGNOSIS — R509 Fever, unspecified: Secondary | ICD-10-CM | POA: Diagnosis not present

## 2019-06-14 DIAGNOSIS — Z8546 Personal history of malignant neoplasm of prostate: Secondary | ICD-10-CM | POA: Insufficient documentation

## 2019-06-14 DIAGNOSIS — E871 Hypo-osmolality and hyponatremia: Secondary | ICD-10-CM | POA: Insufficient documentation

## 2019-06-14 DIAGNOSIS — J111 Influenza due to unidentified influenza virus with other respiratory manifestations: Secondary | ICD-10-CM | POA: Diagnosis not present

## 2019-06-14 DIAGNOSIS — Z79899 Other long term (current) drug therapy: Secondary | ICD-10-CM | POA: Diagnosis not present

## 2019-06-14 DIAGNOSIS — R61 Generalized hyperhidrosis: Secondary | ICD-10-CM | POA: Diagnosis not present

## 2019-06-14 DIAGNOSIS — R7401 Elevation of levels of liver transaminase levels: Secondary | ICD-10-CM | POA: Insufficient documentation

## 2019-06-14 DIAGNOSIS — R6883 Chills (without fever): Secondary | ICD-10-CM | POA: Diagnosis not present

## 2019-06-14 DIAGNOSIS — R000 Tachycardia, unspecified: Secondary | ICD-10-CM | POA: Diagnosis not present

## 2019-06-14 DIAGNOSIS — Z87891 Personal history of nicotine dependence: Secondary | ICD-10-CM | POA: Insufficient documentation

## 2019-06-14 HISTORY — DX: Malignant (primary) neoplasm, unspecified: C80.1

## 2019-06-14 LAB — COMPREHENSIVE METABOLIC PANEL
ALT: 45 U/L — ABNORMAL HIGH (ref 0–44)
AST: 59 U/L — ABNORMAL HIGH (ref 15–41)
Albumin: 3.4 g/dL — ABNORMAL LOW (ref 3.5–5.0)
Alkaline Phosphatase: 100 U/L (ref 38–126)
Anion gap: 10 (ref 5–15)
BUN: 15 mg/dL (ref 6–20)
CO2: 22 mmol/L (ref 22–32)
Calcium: 8.5 mg/dL — ABNORMAL LOW (ref 8.9–10.3)
Chloride: 96 mmol/L — ABNORMAL LOW (ref 98–111)
Creatinine, Ser: 1.01 mg/dL (ref 0.61–1.24)
GFR calc Af Amer: >60 mL/min (ref >60)
GFR calc non Af Amer: >60 mL/min (ref >60)
Glucose, Bld: 132 mg/dL — ABNORMAL HIGH (ref 70–99)
Potassium: 3.5 mmol/L (ref 3.5–5.1)
Sodium: 128 mmol/L — ABNORMAL LOW (ref 135–145)
Total Bilirubin: 1.6 mg/dL — ABNORMAL HIGH (ref 0.3–1.2)
Total Protein: 7.5 g/dL (ref 6.5–8.1)

## 2019-06-14 LAB — CBC WITH DIFFERENTIAL/PLATELET
Abs Immature Granulocytes: 0.04 10*3/uL (ref 0.00–0.07)
Basophils Absolute: 0.1 10*3/uL (ref 0.0–0.1)
Basophils Relative: 1 %
Eosinophils Absolute: 0.1 10*3/uL (ref 0.0–0.5)
Eosinophils Relative: 1 %
HCT: 40.7 % (ref 39.0–52.0)
Hemoglobin: 13.9 g/dL (ref 13.0–17.0)
Immature Granulocytes: 0 %
Lymphocytes Relative: 13 %
Lymphs Abs: 1.3 10*3/uL (ref 0.7–4.0)
MCH: 29.7 pg (ref 26.0–34.0)
MCHC: 34.2 g/dL (ref 30.0–36.0)
MCV: 87 fL (ref 80.0–100.0)
Monocytes Absolute: 1.5 10*3/uL — ABNORMAL HIGH (ref 0.1–1.0)
Monocytes Relative: 14 %
Neutro Abs: 7.1 10*3/uL (ref 1.7–7.7)
Neutrophils Relative %: 71 %
Platelets: 273 10*3/uL (ref 150–400)
RBC: 4.68 MIL/uL (ref 4.22–5.81)
RDW: 11.9 % (ref 11.5–15.5)
WBC: 10 10*3/uL (ref 4.0–10.5)
nRBC: 0 % (ref 0.0–0.2)

## 2019-06-14 LAB — RESPIRATORY PANEL BY RT PCR (FLU A&B, COVID)
Influenza A by PCR: NEGATIVE
Influenza B by PCR: NEGATIVE
SARS Coronavirus 2 by RT PCR: NEGATIVE

## 2019-06-14 LAB — URINALYSIS, ROUTINE W REFLEX MICROSCOPIC
Bilirubin Urine: NEGATIVE
Glucose, UA: NEGATIVE mg/dL
Hgb urine dipstick: NEGATIVE
Ketones, ur: 20 mg/dL — AB
Leukocytes,Ua: NEGATIVE
Nitrite: NEGATIVE
Protein, ur: NEGATIVE mg/dL
Specific Gravity, Urine: 1.012 (ref 1.005–1.030)
pH: 6 (ref 5.0–8.0)

## 2019-06-14 LAB — PROTIME-INR
INR: 1 (ref 0.8–1.2)
Prothrombin Time: 13.3 seconds (ref 11.4–15.2)

## 2019-06-14 LAB — APTT: aPTT: 32 seconds (ref 24–36)

## 2019-06-14 LAB — LACTIC ACID, PLASMA: Lactic Acid, Venous: 1.1 mmol/L (ref 0.5–1.9)

## 2019-06-14 MED ORDER — LACTATED RINGERS IV BOLUS (SEPSIS)
1000.0000 mL | Freq: Once | INTRAVENOUS | Status: AC
  Administered 2019-06-14: 1000 mL via INTRAVENOUS

## 2019-06-14 MED ORDER — LACTATED RINGERS IV BOLUS (SEPSIS)
250.0000 mL | Freq: Once | INTRAVENOUS | Status: AC
  Administered 2019-06-14: 250 mL via INTRAVENOUS

## 2019-06-14 MED ORDER — ACETAMINOPHEN 325 MG PO TABS
650.0000 mg | ORAL_TABLET | Freq: Once | ORAL | Status: AC
  Administered 2019-06-14: 650 mg via ORAL
  Filled 2019-06-14: qty 2

## 2019-06-14 NOTE — Discharge Instructions (Signed)
Drink plenty of fluids.  Take acetaminophen or ibuprofen as needed for fever or aching.  Return if symptoms are getting worse.  Please have your primary care provider repeat your liver tests in 1-2 weeks to make sure they are not getting worse.

## 2019-06-14 NOTE — ED Triage Notes (Signed)
Pt c/o fever with nasal congestion for the last few days;

## 2019-06-14 NOTE — ED Provider Notes (Signed)
[name] Provider Note   CSN: [id number] Arrival date & time: 06/14/19  [id number]   History Chief Complaint  Patient presents with  . Fever    Daniel Atkinson is a 54 y.o. male.  The history is provided by the patient.  He has history of hypertension, prostate cancer and comes in with chills and sweats for the last 3 days.  He has not checked his temperature during this time.  There has been slight nasal congestion and a mild cough which is nonproductive.  He denies sore throat, nausea, vomiting, diarrhea.  Denies any urinary urgency, frequency, tenesmus, dysuria.  He denies arthralgias or myalgias.  He denies sick contacts and specifically denies exposure to COVID-19.  Past Medical History:  Diagnosis Date  . Anxiety   . Cancer [facility])    prostate  . Essential hypertension   . GERD (gastroesophageal reflux disease)   . Headache   . Hiatal hernia     Patient Active Problem List   Diagnosis Date Noted  . Hemorrhoids 07/07/2017  . History of colonic polyps   . Mucosal abnormality of stomach   . Encounter for screening colonoscopy 10/30/2014  . GERD (gastroesophageal reflux disease) 08/30/2014  . Abdominal pain 08/30/2014  . Constipation 08/30/2014    Past Surgical History:  Procedure Laterality Date  . APPENDECTOMY    . BIOPSY N/A 11/22/2014   Procedure: BIOPSY;  Surgeon: Daneil Dolin, MD;  Location: [facility];  Service: Endoscopy;  Laterality: N/A;  gastric,   . COLONOSCOPY WITH PROPOFOL N/A 11/22/2014   RMR: single colonic polyp removed, tubular adenoma  . ESOPHAGOGASTRODUODENOSCOPY (EGD) WITH PROPOFOL N/A 11/22/2014   Dr. Rourk:abnormal  gastric mucosa of doubtful clinical significance status post biopsy. mild chronic gastritis.   Marland Kitchen POLYPECTOMY N/A 11/22/2014   Procedure: POLYPECTOMY;  Surgeon: Daneil Dolin, MD;  Location: [facility];  Service: Endoscopy;  Laterality: N/A;  ascending colon        Family History  Problem Relation Age of Onset  .  Hypertension Mother   . Hypertension Father   . CAD Father        Died age 12  . Colon cancer Neg Hx     Social History   Tobacco Use  . Smoking status: Former Smoker    Packs/day: 1.00    Years: 15.00    Pack years: 15.00    Types: Cigarettes    Quit date: 11/15/2002    Years since quitting: 16.5  . Smokeless tobacco: Never Used  . Tobacco comment: Quit x 12 years  Substance Use Topics  . Alcohol use: No    Alcohol/week: 0.0 standard drinks  . Drug use: No    Home Medications Prior to Admission medications   Medication Sig Start Date End Date Taking? Authorizing Provider  ALPRAZolam (XANAX PO) Take by mouth. 1 at night    [provider]  Calcium Carbonate Antacid (ANTACID PO) Take by mouth. 2 tsps 1-2 times per week    [provider]  Cholecalciferol (VITAMIN D) 2000 units CAPS Take 1 capsule by mouth daily.    [provider]  dexlansoprazole (DEXILANT) 60 MG capsule TAKE 1 CAPSULE(60 MG) BY MOUTH DAILY before breakfast 02/14/19   Mahala Menghini, PA-C  docusate sodium (COLACE) 100 MG capsule Take 100 mg by mouth daily as needed for mild constipation.    [provider]  lisinopril (PRINIVIL,ZESTRIL) 10 MG tablet Take 10 mg by mouth daily. 08/07/14   [provider]  Multiple Vitamin (ONE-A-DAY MENS PO) Take 1 tablet by mouth daily.    [provider]  OVER THE COUNTER MEDICATION Vitamin C/Vitamin D packet in water usually daily    [provider]  sucralfate (CARAFATE) 1 g tablet TAKE 1 TABLET BY MOUTH FOUR TIMES DAILY BEFORE MEALS AND AT BEDTIME AS NEEDED FOR REFLUX Patient taking differently: TAKE 1 TABLET BY MOUTH FOUR TIMES DAILY BEFORE MEALS AND AT BEDTIME AS NEEDED FOR REFLUX. Usually takes 1 tablet twice a day. 03/02/18   Mahala Menghini, PA-C  tamsulosin (FLOMAX) 0.4 MG CAPS capsule Take 1 capsule by mouth daily. 08/12/18 08/12/19  [provider]    Allergies    Hydrocodone  Review of Systems    Review of Systems  All other systems reviewed and are negative.   Physical Exam Updated Vital Signs BP (!) 152/78 (BP Location: Right Arm)   Pulse (!) 133   Temp (!) 102.8 F (39.3 C) (Oral)   Resp 18   Ht 5\' 9"  (1.753 m)   Wt 68 kg   SpO2 97%   BMI 22.15 kg/m   Physical Exam Vitals and nursing note reviewed.   54 year old male, resting comfortably and in no acute distress. Vital signs are significant for elevated heart rate, temperature, blood pressure. Oxygen saturation is 97%, which is normal.  In spite of fever, he does not appear toxic. Head is normocephalic and atraumatic. PERRLA, EOMI. Oropharynx is clear. Neck is nontender and supple without adenopathy or JVD. Back is nontender and there is no CVA tenderness. Lungs are clear without rales, wheezes, or rhonchi. Chest is nontender. Heart has regular rate and rhythm without murmur. Abdomen is soft, flat, nontender without masses or hepatosplenomegaly and peristalsis is normoactive. Extremities have no cyanosis or edema, full range of motion is present. Skin is warm and dry without rash. Neurologic: Mental status is normal, cranial nerves are intact, there are no motor or sensory deficits.  ED Results / Procedures / Treatments   Labs (all labs ordered are listed, but only abnormal results are displayed) Labs Reviewed  COMPREHENSIVE METABOLIC PANEL - Abnormal; Notable for the following components:      Result Value   Sodium 128 (*)    Chloride 96 (*)    Glucose, Bld 132 (*)    Calcium 8.5 (*)    Albumin 3.4 (*)    AST 59 (*)    ALT 45 (*)    Total Bilirubin 1.6 (*)    All other components within normal limits  CBC WITH DIFFERENTIAL/PLATELET - Abnormal; Notable for the following components:   Monocytes Absolute 1.5 (*)    All other components within normal limits  URINALYSIS, ROUTINE W REFLEX MICROSCOPIC - Abnormal; Notable for the following components:   Ketones, ur 20 (*)    All other components within  normal limits  RESPIRATORY PANEL BY RT PCR (FLU A&B, COVID)  CULTURE, BLOOD (ROUTINE X 2)  CULTURE, BLOOD (ROUTINE X 2)  URINE CULTURE  LACTIC ACID, PLASMA  APTT  PROTIME-INR  LACTIC ACID, PLASMA    EKG EKG Interpretation  Date/Time:  Wednesday June 14 2019 03:20:43 EDT Ventricular Rate:  120 PR Interval:    QRS Duration: 83 QT Interval:  311 QTC Calculation: 440 R Axis:   77 Text Interpretation: Sinus tachycardia Baseline wander in lead(s) I III aVL aVF Otherwise within normal limits When compared with ECG of 11/26/2014, No significant change was found Confirmed by Delora Fuel ([id number])  on 06/14/2019 3:45:45 AM   Radiology DG Chest Port 1 View  Result Date: 06/14/2019 CLINICAL DATA:  Fever EXAM: PORTABLE CHEST 1 VIEW COMPARISON:  11/26/2014 FINDINGS: The heart size and mediastinal contours are within normal limits. Both lungs are clear. The visualized skeletal structures are unremarkable. IMPRESSION: No active disease. Electronically Signed   By: Rolm Baptise M.D.   On: 06/14/2019 03:50    Procedures Procedures  Medications Ordered in ED Medications  lactated ringers bolus 1,000 mL (1,000 mLs Intravenous New Bag/Given 06/14/19 0339)    And  lactated ringers bolus 1,000 mL (1,000 mLs Intravenous New Bag/Given 06/14/19 0340)    And  lactated ringers bolus 250 mL (0 mLs Intravenous Stopped 06/14/19 0408)  acetaminophen (TYLENOL) tablet 650 mg (650 mg Oral Given 06/14/19 [id number])    ED Course  I have reviewed the triage vital signs and the nursing notes.  Pertinent labs & imaging results that were available during my care of the patient were reviewed by me and considered in my medical decision making (see chart for details).  MDM Rules/Calculators/A&P Fever with tachycardia, possible sepsis.  In spite of fever, patient is nontoxic in appearance.  Sepsis labs were drawn and he is given acetaminophen for fever and given IV fluids for tachycardia.  Decision is made to hold on  antibiotics and until he source for fever is identified.  Chest x-ray shows no evidence of pneumonia.  WBC is normal with normal differential.  Labs show mild elevation of transaminases and bilirubin, mild hyponatremia.  Clinical significance of all of these is unclear.  On review of past records, he has had mild elevation of transaminases in similar range, but has not had elevated bilirubin before.  Following above-noted treatment, temperatures come down and patient states she feels much better.  Heart rate has dropped to 110, and he continues to be nontoxic in appearance.  Tests for influenza and COVID-19 were negative.  Decision was made to send him home without instituting antibiotics.  However, he was given strict return precautions.  Recommended follow-up with PCP in 1-2 weeks to repeat liver function tests.  Final Clinical Impression(s) / ED Diagnoses Final diagnoses:  Influenza-like illness  Elevated transaminase level  Serum total bilirubin elevated  Hyponatremia    Rx / DC Orders ED Discharge Orders    None       Delora Fuel, MD [id number]

## 2019-06-15 LAB — URINE CULTURE: Culture: NO GROWTH

## 2019-06-19 LAB — CULTURE, BLOOD (ROUTINE X 2)
Culture: NO GROWTH
Culture: NO GROWTH
Special Requests: ADEQUATE
Special Requests: ADEQUATE

## 2019-06-20 DIAGNOSIS — Z6821 Body mass index (BMI) 21.0-21.9, adult: Secondary | ICD-10-CM | POA: Diagnosis not present

## 2019-06-20 DIAGNOSIS — F419 Anxiety disorder, unspecified: Secondary | ICD-10-CM | POA: Diagnosis not present

## 2019-06-20 DIAGNOSIS — K760 Fatty (change of) liver, not elsewhere classified: Secondary | ICD-10-CM | POA: Diagnosis not present

## 2019-07-15 IMAGING — DX DG ABDOMEN 1V
2 series · 2 of 2 positions shown · non-contrast
Comparison: CT abdomen and pelvis August 22, 2014

CLINICAL DATA: Lower abdominal pain with hemorrhoids.

EXAM:
ABDOMEN - 1 VIEW

[abdomen kub (1 of 2)]
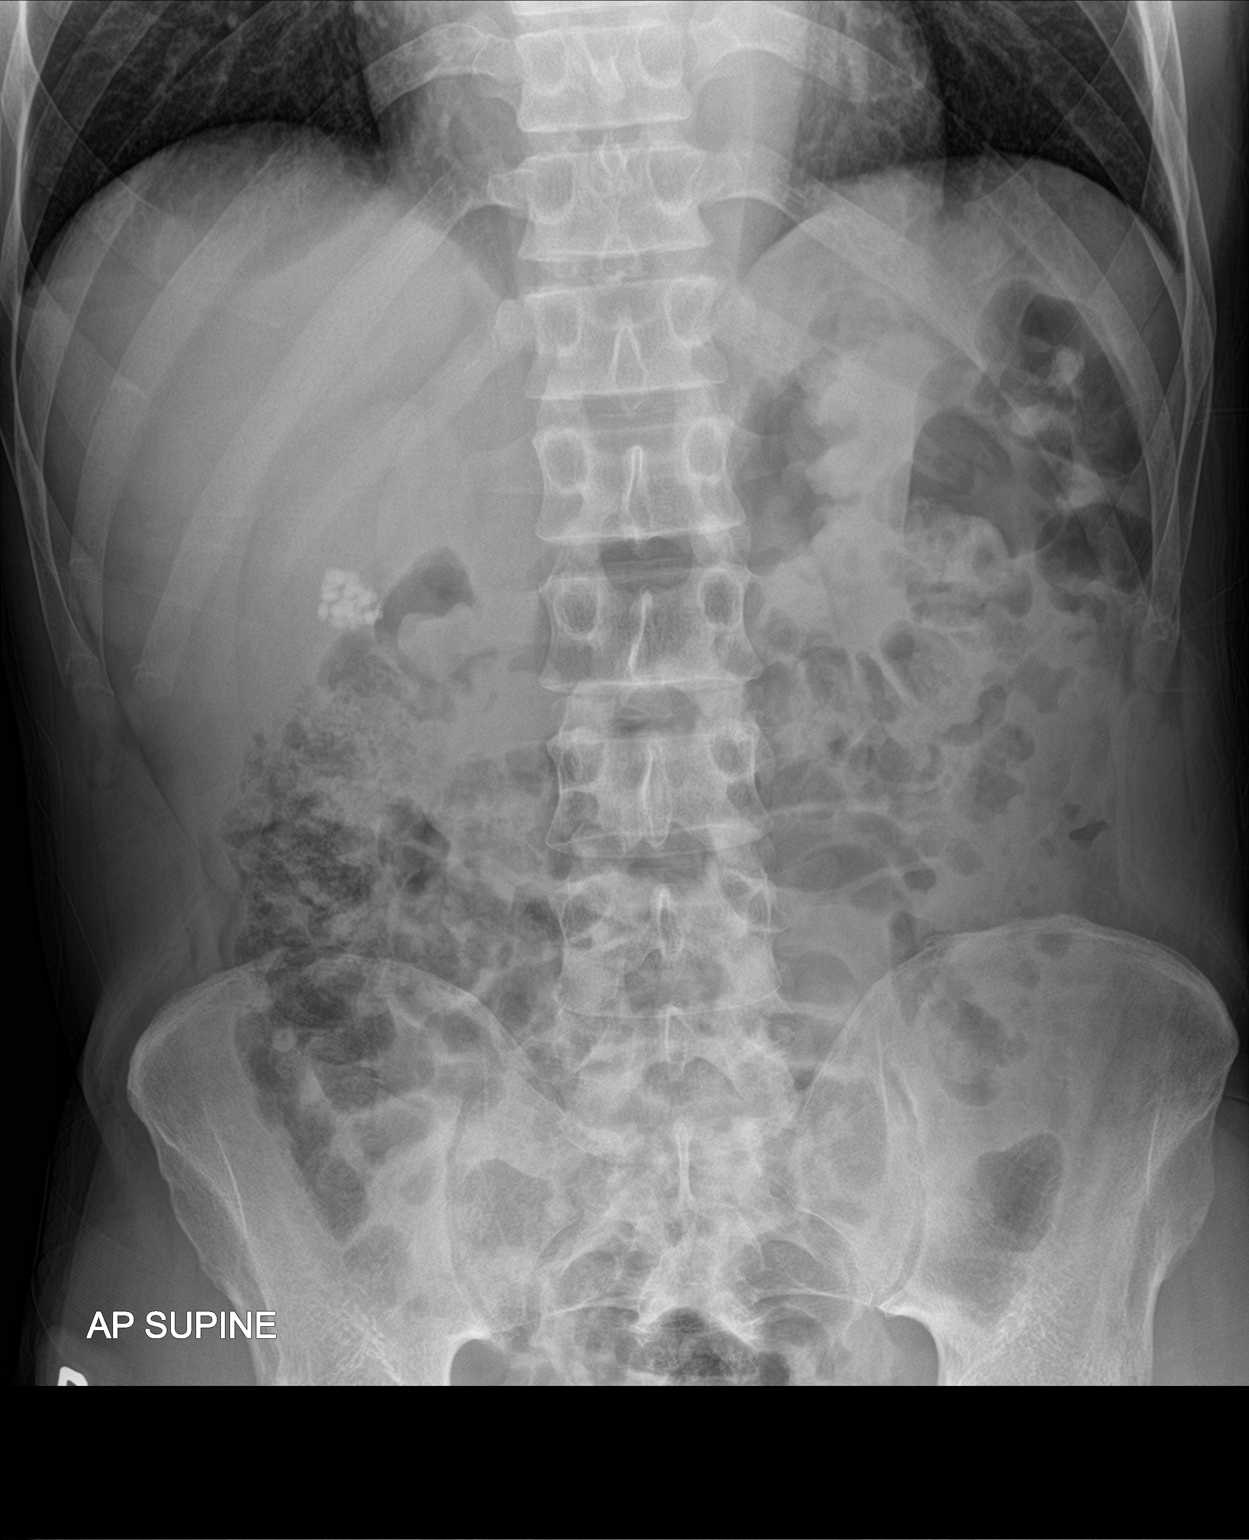

[abdomen kub (2 of 2)]
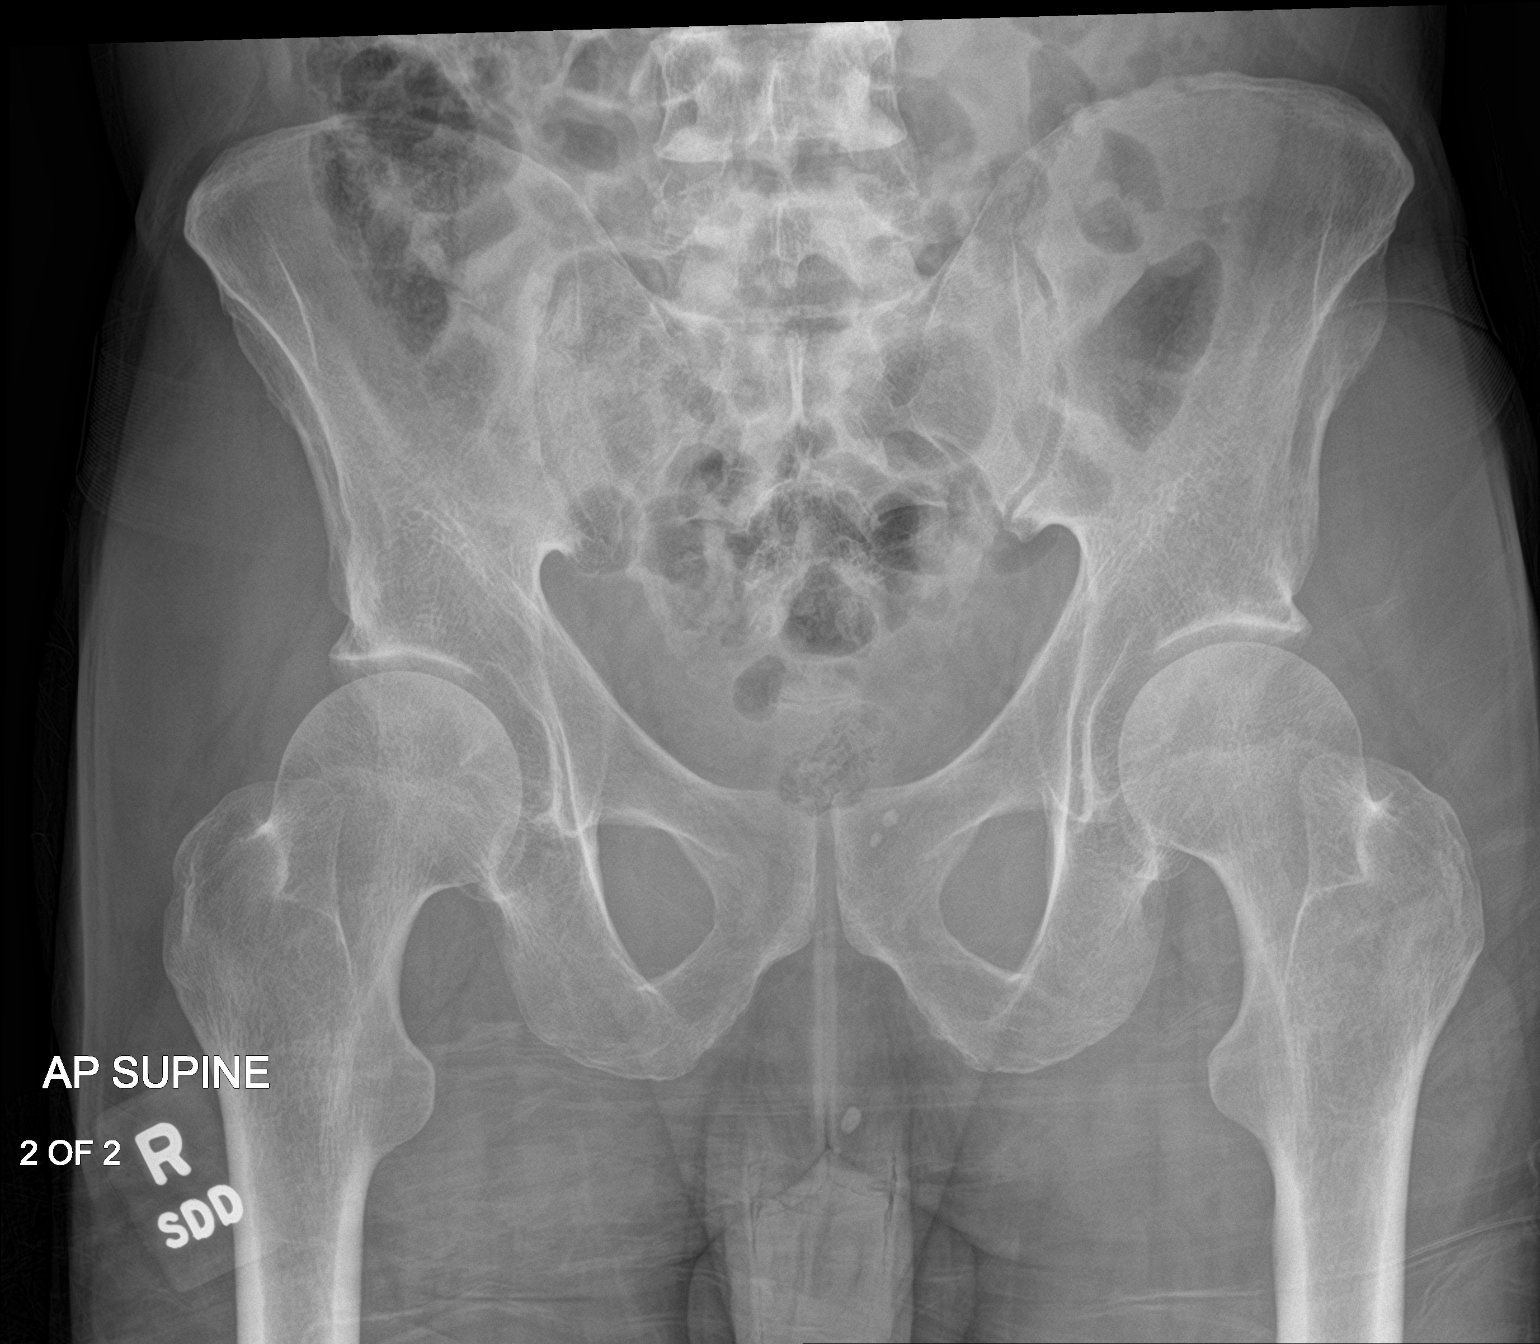

[2 of 2 positions shown; findings below may reference images not displayed]

FINDINGS: There is moderate stool throughout colon. There is no bowel
dilatation or air-fluid level to suggest bowel obstruction. No free
air. Multiple conglomerated calcifications ranging in size from 3 mm
to as large as 9 mm are noted in the right kidney. There are
phleboliths in the pelvis. Lung bases clear.
IMPRESSION: Bowel gas pattern unremarkable. No bowel obstruction or free air.
Moderate stool in colon. Multiple conglomerated right renal calculi.
Lung bases clear.

## 2019-07-20 ENCOUNTER — Ambulatory Visit: Payer: BC Managed Care – PPO | Attending: Internal Medicine

## 2019-07-20 ENCOUNTER — Ambulatory Visit: Payer: BC Managed Care – PPO

## 2019-07-20 DIAGNOSIS — Z23 Encounter for immunization: Secondary | ICD-10-CM

## 2019-07-20 NOTE — Progress Notes (Signed)
   Covid-19 Vaccination Clinic  Name:  Daniel Atkinson    MRN: [id number] DOB: [birth year]  07/20/2019  [name] was observed post Covid-19 immunization for 15 minutes without incident. He was provided with Vaccine Information Sheet and instruction to access the V-Safe system.   [name] was instructed to call 911 with any severe reactions post vaccine: Marland Kitchen Difficulty breathing  . Swelling of face and throat  . A fast heartbeat  . A bad rash all over body  . Dizziness and weakness   Immunizations Administered    Name Date Dose VIS Date Route   Moderna COVID-19 Vaccine 07/20/2019  8:22 AM 0.5 mL 02/2019 Intramuscular   Manufacturer: Moderna   Lot: [id number]   [id number]

## 2019-08-14 ENCOUNTER — Encounter: Payer: Self-pay | Admitting: Internal Medicine

## 2019-08-17 ENCOUNTER — Ambulatory Visit: Payer: BC Managed Care – PPO | Attending: Internal Medicine

## 2019-08-17 DIAGNOSIS — Z23 Encounter for immunization: Secondary | ICD-10-CM

## 2019-08-17 NOTE — Progress Notes (Signed)
   Covid-19 Vaccination Clinic  Name:  Daniel Atkinson    MRN: [id number] DOB: [birth year]  08/17/2019  [name] was observed post Covid-19 immunization for 15 minutes without incident. He was provided with Vaccine Information Sheet and instruction to access the V-Safe system.   [name] was instructed to call 911 with any severe reactions post vaccine: Marland Kitchen Difficulty breathing  . Swelling of face and throat  . A fast heartbeat  . A bad rash all over body  . Dizziness and weakness   Immunizations Administered    Name Date Dose VIS Date Route   Moderna COVID-19 Vaccine 08/17/2019  9:39 AM 0.5 mL 02/2019 Intramuscular   Manufacturer: Moderna   Lot: [id number]   [id number]

## 2019-08-31 DIAGNOSIS — Z0000 Encounter for general adult medical examination without abnormal findings: Secondary | ICD-10-CM | POA: Diagnosis not present

## 2019-08-31 DIAGNOSIS — Z6822 Body mass index (BMI) 22.0-22.9, adult: Secondary | ICD-10-CM | POA: Diagnosis not present

## 2019-10-09 DIAGNOSIS — C61 Malignant neoplasm of prostate: Secondary | ICD-10-CM | POA: Diagnosis not present

## 2019-10-09 DIAGNOSIS — Z6822 Body mass index (BMI) 22.0-22.9, adult: Secondary | ICD-10-CM | POA: Diagnosis not present

## 2019-11-02 ENCOUNTER — Ambulatory Visit: Payer: BC Managed Care – PPO | Admitting: Nurse Practitioner

## 2019-11-02 ENCOUNTER — Encounter: Payer: Self-pay | Admitting: Nurse Practitioner

## 2019-11-02 ENCOUNTER — Other Ambulatory Visit: Payer: Self-pay

## 2019-11-02 VITALS — BP 159/90 | HR 72 | Temp 96.9°F | Ht 69.0 in | Wt 152.6 lb

## 2019-11-02 DIAGNOSIS — K219 Gastro-esophageal reflux disease without esophagitis: Secondary | ICD-10-CM

## 2019-11-02 DIAGNOSIS — Z86010 Personal history of colonic polyps: Secondary | ICD-10-CM

## 2019-11-02 DIAGNOSIS — K5900 Constipation, unspecified: Secondary | ICD-10-CM | POA: Diagnosis not present

## 2019-11-02 NOTE — Patient Instructions (Addendum)
Your health issues we discussed today were:   GERD (reflux/heartburn): 1. Glad you are doing well with this! 2. Continue taking Dexilant daily and Carafate as needed 3. Call us for any worsening or severe symptoms  Constipation: 1. I am glad this is also going well for you! 2. Continue your stool softeners 3. You can use MiraLAX as needed for any breakthrough constipation symptoms 4. Call us for any worsening or severe symptoms  Need for colonoscopy due to a history of colon polyps: 1. As we discussed you are currently due for repeat colonoscopy 2. We will schedule this for you 3. Further recommendations will follow your colonoscopy  Overall I recommend:  1. Continue your other current medications 2. Return for follow-up 1 year 3. Call us if you have any questions or concerns.   ---------------------------------------------------------------  I am glad you have gotten your COVID-19 vaccination!  Even though you are fully vaccinated you should continue to follow CDC and state/local guidelines.  ---------------------------------------------------------------   At [facility] we value your feedback. You may receive a survey about your visit today. Please share your experience as we strive to create trusting relationships with our patients to provide genuine, compassionate, quality care.  We appreciate your understanding and patience as we review any laboratory studies, imaging, and other diagnostic tests that are ordered as we care for you. Our office policy is 5 business days for review of these results, and any emergent or urgent results are addressed in a timely manner for your best interest. If you do not hear from our office in 1 week, please contact us.   We also encourage the use of MyChart, which contains your medical information for your review as well. If you are not enrolled in this feature, an access code is on this after visit summary for your convenience.  Thank you for allowing [place] to be involved in your care.  It was great to see you today!  I hope you have a great rest of your summer!!  Stay hydrated while working in the sun!

## 2019-11-02 NOTE — Progress Notes (Signed)
Referring Provider: Redmond School, MD Primary Care Physician:  Redmond School, MD Primary GI:  Dr. Gala Romney  Chief Complaint  Patient presents with  . Gastroesophageal Reflux    f/u, doing ok    HPI:   Daniel Atkinson is a 54 y.o. male who presents for 1 year follow-up.  Patient was last seen in our office 09/19/2018 for GERD and constipation.  History of GERD, adenomatous with next colonoscopy in 2021.  At his last visit he controls constipation with stool softener and no MiraLAX needed.  Dexilant works well for his GERD.  Decreased Carafate to twice daily.  Overall doing well, no other overt GI complaints.  Recommended continue current medications, follow-up in 1 year to arrange colonoscopy.  Today he states he is doing okay overall. 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 GERD has been doing better, not needing as much Carafate (typically two a day). Also using Dexilant once a day. Has been able to eat stuff he hasn't been able to for years, but he is "only having little bits of those to not overdo it). He been doing well for constipation with stool softener only. Denies abdominal pain, N/V, hematochezia, melena, fever, chills, unintentional weight loss. He is due for colonoscopy this year and is agreeable to proceed. Denies URI or flu-like symptoms. Denies loss of sense of taste or smell. The patient has received COVID-19 vaccination(s). Denies chest pain, dyspnea, dizziness, lightheadedness, syncope, near syncope. Denies any other upper or lower GI symptoms.   Uses one Xanax a night for anxiety and sleep  Past Medical History:  Diagnosis Date  . Anxiety   . Cancer [facility])    prostate  . Essential hypertension   . GERD (gastroesophageal reflux disease)   . Headache   . Hiatal hernia     Past Surgical History:  Procedure Laterality Date  . APPENDECTOMY    . BIOPSY N/A 11/22/2014   Procedure: BIOPSY;  Surgeon: Daneil Dolin, MD;  Location: [facility];  Service:  Endoscopy;  Laterality: N/A;  gastric,   . COLONOSCOPY WITH PROPOFOL N/A 11/22/2014   RMR: single colonic polyp removed, tubular adenoma  . ESOPHAGOGASTRODUODENOSCOPY (EGD) WITH PROPOFOL N/A 11/22/2014   Dr. Rourk:abnormal  gastric mucosa of doubtful clinical significance status post biopsy. mild chronic gastritis.   Marland Kitchen POLYPECTOMY N/A 11/22/2014   Procedure: POLYPECTOMY;  Surgeon: Daneil Dolin, MD;  Location: [facility];  Service: Endoscopy;  Laterality: N/A;  ascending colon     Current Outpatient Medications  Medication Sig Dispense Refill  . ALPRAZolam (XANAX PO) Take by mouth. 1 at night    . Cholecalciferol (VITAMIN D) 2000 units CAPS Take 1 capsule by mouth daily.    Marland Kitchen dexlansoprazole (DEXILANT) 60 MG capsule TAKE 1 CAPSULE(60 MG) BY MOUTH DAILY before breakfast 90 capsule 3  . docusate sodium (COLACE) 100 MG capsule Take 100 mg by mouth daily as needed for mild constipation.    Marland Kitchen lisinopril (PRINIVIL,ZESTRIL) 10 MG tablet Take 10 mg by mouth daily.  5  . sucralfate (CARAFATE) 1 g tablet TAKE 1 TABLET BY MOUTH FOUR TIMES DAILY BEFORE MEALS AND AT BEDTIME AS NEEDED FOR REFLUX (Patient taking differently: TAKE 1 TABLET BY MOUTH FOUR TIMES DAILY BEFORE MEALS AND AT BEDTIME AS NEEDED FOR REFLUX. Usually takes 1 tablet twice a day.) 360 tablet 0   No current facility-administered medications for this visit.    Allergies as of 11/02/2019 - Review Complete 11/02/2019  Allergen  Reaction Noted  . Hydrocodone Nausea And Vomiting 08/09/2014    Family History  Problem Relation Age of Onset  . Hypertension Mother   . Hypertension Father   . CAD Father        Died age 44  . Colon cancer Neg Hx     Social History   Socioeconomic History  . Marital status: Married    Spouse name: Not on file  . Number of children: Not on file  . Years of education: Not on file  . Highest education level: Not on file  Occupational History  . Not on file  Tobacco Use  . Smoking status: Former Smoker     Packs/day: 1.00    Years: 15.00    Pack years: 15.00    Types: Cigarettes    Quit date: 11/15/2002    Years since quitting: 16.9  . Smokeless tobacco: Never Used  . Tobacco comment: Quit x 12 years  Vaping Use  . Vaping Use: Never used  Substance and Sexual Activity  . Alcohol use: No    Alcohol/week: 0.0 standard drinks  . Drug use: No  . Sexual activity: Yes    Birth control/protection: None  Other Topics Concern  . Not on file  Social History Narrative  . Not on file   Social Determinants of Health   Financial Resource Strain:   . Difficulty of Paying Living Expenses:   Food Insecurity:   . Worried About Charity fundraiser in the Last Year:   . Arboriculturist in the Last Year:   Transportation Needs:   . Film/video editor (Medical):   Marland Kitchen Lack of Transportation (Non-Medical):   Physical Activity:   . Days of Exercise per Week:   . Minutes of Exercise per Session:   Stress:   . Feeling of Stress :   Social Connections:   . Frequency of Communication with Friends and Family:   . Frequency of Social Gatherings with Friends and Family:   . Attends Religious Services:   . Active Member of Clubs or Organizations:   . Attends Archivist Meetings:   Marland Kitchen Marital Status:     Subjective: Review of Systems  Constitutional: Negative for chills, fever, malaise/fatigue and weight loss.  HENT: Negative for congestion and sore throat.   Respiratory: Negative for cough and shortness of breath.   Cardiovascular: Negative for chest pain and palpitations.  Gastrointestinal: Negative for abdominal pain, blood in stool, diarrhea, melena, nausea and vomiting.  Musculoskeletal: Negative for joint pain and myalgias.  Skin: Negative for rash.  Neurological: Negative for dizziness and weakness.  Endo/Heme/Allergies: Does not bruise/bleed easily.  Psychiatric/Behavioral: Negative for depression. The patient is not nervous/anxious.   All other systems reviewed and are  negative.    Objective: BP (!) 159/90   Pulse 72   Temp (!) 96.9 F (36.1 C) (Temporal)   Ht _0  (1.753 m)   Wt 152 lb 9.6 oz (69.2 kg)   BMI 22.54 kg/m  Physical Exam Vitals and nursing note reviewed.  Constitutional:      General: He is not in acute distress.    Appearance: Normal appearance. He is not ill-appearing, toxic-appearing or diaphoretic.  HENT:     Head: Normocephalic and atraumatic.     Nose: No congestion or rhinorrhea.  Eyes:     General: No scleral icterus. Cardiovascular:     Rate and Rhythm: Normal rate and regular rhythm.     Heart sounds:  Normal heart sounds.  Pulmonary:     Effort: Pulmonary effort is normal.     Breath sounds: Normal breath sounds.  Abdominal:     General: Bowel sounds are normal. There is no distension.     Palpations: Abdomen is soft. There is no hepatomegaly, splenomegaly or mass.     Tenderness: There is no abdominal tenderness. There is no guarding or rebound.     Hernia: No hernia is present.  Musculoskeletal:     Cervical back: Neck supple.  Skin:    General: Skin is warm and dry.     Coloration: Skin is not jaundiced.     Findings: No bruising or rash.  Neurological:     General: No focal deficit present.     Mental Status: He is alert and oriented to person, place, and time. Mental status is at baseline.  Psychiatric:        Mood and Affect: Mood normal.        Behavior: Behavior normal.        Thought Content: Thought content normal.      Assessment:  Very pleasant 54 year old male who presents for follow-up on GERD and constipation.  His constipation is well managed currently with stool softeners only, does not required MiraLAX.  GERD is doing well in the setting of a history of chronic symptoms.  Currently doing well on Dexilant once daily and Carafate as needed, although he is requiring less these days.  Currently due for colonoscopy as his last one was in 2016 recommended 5-year repeat.  He can proceed with  scheduling at this time.  No red flag/warning signs or symptoms   Plan: Continue current medications Follow-up in 1 year Call for worsening symptoms Colonoscopy as able to schedule  Proceed with TCS on propofol/MAC with Dr. Gala Romney in near future: the risks, benefits, and alternatives have been discussed with the patient in detail. The patient states understanding and desires to proceed.  The patient is currently on Xanax daily. The patient is not on any other anticoagulants, anxiolytics, chronic pain medications, antidepressants, antidiabetics, or iron supplements.  We will plan for the procedure on propofol, as per his last procedure as well, to promote adequate sedation.  ASA II    11/02/2019 1:54 PM   Disclaimer: This note was dictated with voice recognition software. Similar sounding words can inadvertently be transcribed and may not be corrected upon review.

## 2019-11-03 DIAGNOSIS — H11133 Conjunctival pigmentations, bilateral: Secondary | ICD-10-CM | POA: Diagnosis not present

## 2019-12-06 DIAGNOSIS — F419 Anxiety disorder, unspecified: Secondary | ICD-10-CM | POA: Diagnosis not present

## 2019-12-13 ENCOUNTER — Other Ambulatory Visit (HOSPITAL_COMMUNITY)
Admission: RE | Admit: 2019-12-13 | Discharge: 2019-12-13 | Disposition: A | Discharge status: Home / Self Care | Payer: BC Managed Care – PPO | Source: Ambulatory Visit | Attending: Internal Medicine | Admitting: Internal Medicine

## 2019-12-13 ENCOUNTER — Other Ambulatory Visit: Payer: Self-pay

## 2019-12-13 DIAGNOSIS — Z20822 Contact with and (suspected) exposure to covid-19: Secondary | ICD-10-CM | POA: Diagnosis not present

## 2019-12-13 DIAGNOSIS — Z01812 Encounter for preprocedural laboratory examination: Secondary | ICD-10-CM | POA: Insufficient documentation

## 2019-12-13 LAB — SARS CORONAVIRUS 2 (TAT 6-24 HRS): SARS Coronavirus 2: NEGATIVE

## 2019-12-14 ENCOUNTER — Ambulatory Visit (HOSPITAL_COMMUNITY): Payer: BC Managed Care – PPO | Admitting: Anesthesiology

## 2019-12-14 ENCOUNTER — Other Ambulatory Visit: Payer: Self-pay

## 2019-12-14 ENCOUNTER — Encounter (HOSPITAL_COMMUNITY): Payer: Self-pay | Admitting: Internal Medicine

## 2019-12-14 ENCOUNTER — Ambulatory Visit (HOSPITAL_COMMUNITY)
Admission: RE | Admit: 2019-12-14 | Discharge: 2019-12-14 | Disposition: A | Discharge status: Home / Self Care | Payer: BC Managed Care – PPO | Attending: Internal Medicine | Admitting: Internal Medicine

## 2019-12-14 ENCOUNTER — Encounter (HOSPITAL_COMMUNITY)
Admission: RE | Disposition: A | Discharge status: Home / Self Care | Payer: Self-pay | Source: Home / Self Care | Attending: Internal Medicine

## 2019-12-14 DIAGNOSIS — Z87891 Personal history of nicotine dependence: Secondary | ICD-10-CM | POA: Insufficient documentation

## 2019-12-14 DIAGNOSIS — D123 Benign neoplasm of transverse colon: Secondary | ICD-10-CM | POA: Insufficient documentation

## 2019-12-14 DIAGNOSIS — Z1211 Encounter for screening for malignant neoplasm of colon: Secondary | ICD-10-CM | POA: Insufficient documentation

## 2019-12-14 DIAGNOSIS — K449 Diaphragmatic hernia without obstruction or gangrene: Secondary | ICD-10-CM | POA: Insufficient documentation

## 2019-12-14 DIAGNOSIS — I10 Essential (primary) hypertension: Secondary | ICD-10-CM | POA: Diagnosis not present

## 2019-12-14 DIAGNOSIS — K219 Gastro-esophageal reflux disease without esophagitis: Secondary | ICD-10-CM | POA: Insufficient documentation

## 2019-12-14 DIAGNOSIS — Z86010 Personal history of colonic polyps: Secondary | ICD-10-CM | POA: Diagnosis not present

## 2019-12-14 DIAGNOSIS — Z8249 Family history of ischemic heart disease and other diseases of the circulatory system: Secondary | ICD-10-CM | POA: Insufficient documentation

## 2019-12-14 DIAGNOSIS — R519 Headache, unspecified: Secondary | ICD-10-CM | POA: Insufficient documentation

## 2019-12-14 DIAGNOSIS — Z8546 Personal history of malignant neoplasm of prostate: Secondary | ICD-10-CM | POA: Diagnosis not present

## 2019-12-14 DIAGNOSIS — F419 Anxiety disorder, unspecified: Secondary | ICD-10-CM | POA: Diagnosis not present

## 2019-12-14 DIAGNOSIS — Z79899 Other long term (current) drug therapy: Secondary | ICD-10-CM | POA: Diagnosis not present

## 2019-12-14 DIAGNOSIS — Z09 Encounter for follow-up examination after completed treatment for conditions other than malignant neoplasm: Secondary | ICD-10-CM | POA: Diagnosis not present

## 2019-12-14 DIAGNOSIS — K635 Polyp of colon: Secondary | ICD-10-CM

## 2019-12-14 DIAGNOSIS — Z885 Allergy status to narcotic agent status: Secondary | ICD-10-CM | POA: Insufficient documentation

## 2019-12-14 HISTORY — PX: COLONOSCOPY WITH PROPOFOL: SHX5780

## 2019-12-14 HISTORY — PX: POLYPECTOMY: SHX5525

## 2019-12-14 SURGERY — COLONOSCOPY WITH PROPOFOL
Anesthesia: General

## 2019-12-14 MED ORDER — CHLORHEXIDINE GLUCONATE CLOTH 2 % EX PADS: 6.0000 | MEDICATED_PAD | Freq: Once | CUTANEOUS | Status: DC

## 2019-12-14 MED ORDER — LACTATED RINGERS IV SOLN: INTRAVENOUS | Status: DC | PRN

## 2019-12-14 MED ORDER — LACTATED RINGERS IV SOLN: Freq: Once | INTRAVENOUS | Status: AC

## 2019-12-14 MED ORDER — STERILE WATER FOR IRRIGATION IR SOLN
Status: DC | PRN
  Administered 2019-12-14: 1.5 mL

## 2019-12-14 MED ORDER — PROPOFOL 500 MG/50ML IV EMUL
INTRAVENOUS | Status: DC | PRN
  Administered 2019-12-14: 200 ug/kg/min via INTRAVENOUS

## 2019-12-14 MED ORDER — PROPOFOL 10 MG/ML IV BOLUS
INTRAVENOUS | Status: DC | PRN
  Administered 2019-12-14: 100 mg via INTRAVENOUS

## 2019-12-14 MED ORDER — PROPOFOL 10 MG/ML IV BOLUS
INTRAVENOUS | Status: AC
  Filled 2019-12-14: qty 20

## 2019-12-14 NOTE — Op Note (Signed)
[facility] Patient Name: Daniel Atkinson Procedure Date: 12/14/2019 9:59 AM MRN: [id number] Date of Birth: 10/20/1965 Attending MD: Norvel Richards , MD CSN: [id number] Age: 54 Admit Type: Outpatient Procedure:                Colonoscopy Indications:              High risk colon cancer surveillance: Personal                            history of colonic polyps Providers:                Norvel Richards, MD, Otis Peak B. Sharon Seller, RN,                            Caprice Kluver, Kristine L. Risa Grill, Technician,                            Aram Candela Referring MD:              Medicines:                Propofol per Anesthesia Complications:            No immediate complications. Estimated Blood Loss:     Estimated blood loss was minimal. Procedure:                Pre-Anesthesia Assessment:                           - Prior to the procedure, a History and Physical                            was performed, and patient medications and                            allergies were reviewed. The patient's tolerance of                            previous anesthesia was also reviewed. The risks                            and benefits of the procedure and the sedation                            options and risks were discussed with the patient.                            All questions were answered, and informed consent                            was obtained. Prior Anticoagulants: The patient has                            taken no previous anticoagulant or antiplatelet  agents. ASA Grade Assessment: II - A patient with                            mild systemic disease. After reviewing the risks                            and benefits, the patient was deemed in                            satisfactory condition to undergo the procedure.                           After obtaining informed consent, the colonoscope                            was passed under direct  vision. Throughout the                            procedure, the patient's blood pressure, pulse, and                            oxygen saturations were monitored continuously. The                            [id number]-[id number] ([id number]) scope was introduced through                            the anus and advanced to the the cecum, identified                            by appendiceal orifice and ileocecal valve. The                            colonoscopy was performed without difficulty. The                            patient tolerated the procedure well. The quality                            of the bowel preparation was adequate. Scope In: 10:16:02 AM Scope Out: 10:28:36 AM Scope Withdrawal Time: 0 hours 8 minutes 44 seconds  Total Procedure Duration: 0 hours 12 minutes 34 seconds  Findings:      The perianal and digital rectal examinations were normal.      A 5 mm polyp was found in the hepatic flexure. The polyp was sessile.       The polyp was removed with a cold snare. Resection and retrieval were       complete. Estimated blood loss was minimal.      The exam was otherwise without abnormality on direct and retroflexion       views. Impression:               - One 5 mm polyp at the hepatic flexure, removed  with a cold snare. Resected and retrieved.                           - The examination was otherwise normal on direct                            and retroflexion views. Moderate Sedation:      Moderate (conscious) sedation was personally administered by an       anesthesia professional. The following parameters were monitored: oxygen       saturation, heart rate, blood pressure, respiratory rate, EKG, adequacy       of pulmonary ventilation, and response to care. Recommendation:           - Patient has a contact number available for                            emergencies. The signs and symptoms of potential                            delayed complications were  discussed with the                            patient. Return to normal activities tomorrow.                            Written discharge instructions were provided to the                            patient.                           - Resume previous diet.                           - Continue present medications.                           - Repeat colonoscopy date to be determined after                            pending pathology results are reviewed for                            surveillance based on pathology results.                           - Return to GI office in 1 year. Procedure Code(s):        --- Professional ---                           [phone], Colonoscopy, flexible; with removal of                            tumor(s), polyp(s), or other lesion(s) by snare  technique Diagnosis Code(s):        --- Professional ---                           Z86.010, Personal history of colonic polyps                           K63.5, Polyp of colon CPT copyright 2019 American Medical Association. All rights reserved. The codes documented in this report are preliminary and upon coder review may  be revised to meet current compliance requirements. Cristopher Estimable. Shanley Furlough, MD Norvel Richards, MD 12/14/2019 10:32:55 AM This report has been signed electronically. Number of Addenda: 0

## 2019-12-14 NOTE — Discharge Instructions (Signed)
Colonoscopy Discharge Instructions  Read the instructions outlined below and refer to this sheet in the next few weeks. These discharge instructions provide you with general information on caring for yourself after you leave the hospital. Your doctor may also give you specific instructions. While your treatment has been planned according to the most current medical practices available, unavoidable complications occasionally occur. If you have any problems or questions after discharge, call Dr. Gala Romney at [phone]. ACTIVITY  You may resume your regular activity, but move at a slower pace for the next 24 hours.   Take frequent rest periods for the next 24 hours.   Walking will help get rid of the air and reduce the bloated feeling in your belly (abdomen).   No driving for 24 hours (because of the medicine (anesthesia) used during the test).    Do not sign any important legal documents or operate any machinery for 24 hours (because of the anesthesia used during the test).  NUTRITION  Drink plenty of fluids.   You may resume your normal diet as instructed by your doctor.   Begin with a light meal and progress to your normal diet. Heavy or fried foods are harder to digest and may make you feel sick to your stomach (nauseated).   Avoid alcoholic beverages for 24 hours or as instructed.  MEDICATIONS  You may resume your normal medications unless your doctor tells you otherwise.  WHAT YOU CAN EXPECT TODAY  Some feelings of bloating in the abdomen.   Passage of more gas than usual.   Spotting of blood in your stool or on the toilet paper.  IF YOU HAD POLYPS REMOVED DURING THE COLONOSCOPY:  No aspirin products for 7 days or as instructed.   No alcohol for 7 days or as instructed.   Eat a soft diet for the next 24 hours.  FINDING OUT THE RESULTS OF YOUR TEST Not all test results are available during your visit. If your test results are not back during the visit, make an appointment  with your caregiver to find out the results. Do not assume everything is normal if you have not heard from your caregiver or the medical facility. It is important for you to follow up on all of your test results.  SEEK IMMEDIATE MEDICAL ATTENTION IF:  You have more than a spotting of blood in your stool.   Your belly is swollen (abdominal distention).   You are nauseated or vomiting.   You have a temperature over 101.   You have abdominal pain or discomfort that is severe or gets worse throughout the day.    1 polyp removed from your colon today  Further recommendations to follow pending review of pathology report  At patient request, I called Daniel Atkinson at [phone] -was told she was not there.    Monitored Anesthesia Care, Care After These instructions provide you with information about caring for yourself after your procedure. Your health care provider may also give you more specific instructions. Your treatment has been planned according to current medical practices, but problems sometimes occur. Call your health care provider if you have any problems or questions after your procedure. What can I expect after the procedure? After your procedure, you may:  Feel sleepy for several hours.  Feel clumsy and have poor balance for several hours.  Feel forgetful about what happened after the procedure.  Have poor judgment for several hours.  Feel nauseous or vomit.  Have a sore throat if you  had a breathing tube during the procedure. Follow these instructions at home: For at least 24 hours after the procedure:      Have a responsible adult stay with you. It is important to have someone help care for you until you are awake and alert.  Rest as needed.  Do not: ? Participate in activities in which you could fall or become injured. ? Drive. ? Use heavy machinery. ? Drink alcohol. ? Take sleeping pills or medicines that cause drowsiness. ? Make important decisions  or sign legal documents. ? Take care of children on your own. Eating and drinking  Follow the diet that is recommended by your health care provider.  If you vomit, drink water, juice, or soup when you can drink without vomiting.  Make sure you have little or no nausea before eating solid foods. General instructions  Take over-the-counter and prescription medicines only as told by your health care provider.  If you have sleep apnea, surgery and certain medicines can increase your risk for breathing problems. Follow instructions from your health care provider about wearing your sleep device: ? Anytime you are sleeping, including during daytime naps. ? While taking prescription pain medicines, sleeping medicines, or medicines that make you drowsy.  If you smoke, do not smoke without supervision.  Keep all follow-up visits as told by your health care provider. This is important. Contact a health care provider if:  You keep feeling nauseous or you keep vomiting.  You feel light-headed.  You develop a rash.  You have a fever. Get help right away if:  You have trouble breathing. Summary  For several hours after your procedure, you may feel sleepy and have poor judgment.  Have a responsible adult stay with you for at least 24 hours or until you are awake and alert. This information is not intended to replace advice given to you by your health care provider. Make sure you discuss any questions you have with your health care provider. Document Revised: 06/07/2017 Document Reviewed: 06/30/2015 Elsevier Patient Education  [place] After These instructions provide you with information about caring for yourself after your procedure. Your health care provider may also give you more specific instructions. Your treatment has been planned according to current medical practices, but problems sometimes occur. Call your health care provider if you  have any problems or questions after your procedure. What can I expect after the procedure? After your procedure, you may:  Feel sleepy for several hours.  Feel clumsy and have poor balance for several hours.  Feel forgetful about what happened after the procedure.  Have poor judgment for several hours.  Feel nauseous or vomit.  Have a sore throat if you had a breathing tube during the procedure. Follow these instructions at home: For at least 24 hours after the procedure:      Have a responsible adult stay with you. It is important to have someone help care for you until you are awake and alert.  Rest as needed.  Do not: ? Participate in activities in which you could fall or become injured. ? Drive. ? Use heavy machinery. ? Drink alcohol. ? Take sleeping pills or medicines that cause drowsiness. ? Make important decisions or sign legal documents. ? Take care of children on your own. Eating and drinking  Follow the diet that is recommended by your health care provider.  If you vomit, drink water, juice, or soup when you can  drink without vomiting.  Make sure you have little or no nausea before eating solid foods. General instructions  Take over-the-counter and prescription medicines only as told by your health care provider.  If you have sleep apnea, surgery and certain medicines can increase your risk for breathing problems. Follow instructions from your health care provider about wearing your sleep device: ? Anytime you are sleeping, including during daytime naps. ? While taking prescription pain medicines, sleeping medicines, or medicines that make you drowsy.  If you smoke, do not smoke without supervision.  Keep all follow-up visits as told by your health care provider. This is important. Contact a health care provider if:  You keep feeling nauseous or you keep vomiting.  You feel light-headed.  You develop a rash.  You have a fever. Get help right  away if:  You have trouble breathing. Summary  For several hours after your procedure, you may feel sleepy and have poor judgment.  Have a responsible adult stay with you for at least 24 hours or until you are awake and alert. This information is not intended to replace advice given to you by your health care provider. Make sure you discuss any questions you have with your health care provider. Document Revised: 06/07/2017 Document Reviewed: 06/30/2015 Elsevier Patient Education  [place].

## 2019-12-14 NOTE — Anesthesia Preprocedure Evaluation (Addendum)
Anesthesia Evaluation  Patient identified by MRN, date of birth, ID band Patient awake    Reviewed: Allergy & Precautions, NPO status , Patient's Chart, lab work & pertinent test results  History of Anesthesia Complications Negative for: history of anesthetic complications  Airway Mallampati: II  TM Distance: >3 FB Neck ROM: Full    Dental  (+) Dental Advisory Given, Caps   Pulmonary neg pulmonary ROS, former smoker,    Pulmonary exam normal breath sounds clear to auscultation       Cardiovascular Exercise Tolerance: Good hypertension, Pt. on medications Normal cardiovascular exam Rhythm:Regular Rate:Normal     Neuro/Psych  Headaches, Anxiety    GI/Hepatic Neg liver ROS, hiatal hernia, GERD  Medicated and Controlled,  Endo/Other  negative endocrine ROS  Renal/GU negative Renal ROS     Musculoskeletal negative musculoskeletal ROS (+)   Abdominal   Peds  Hematology negative hematology ROS (+)   Anesthesia Other Findings   Reproductive/Obstetrics negative OB ROS                            Anesthesia Physical Anesthesia Plan  ASA: II  Anesthesia Plan: General   Post-op Pain Management:    Induction: Intravenous  PONV Risk Score and Plan: TIVA  Airway Management Planned: Nasal Cannula and Natural Airway  Additional Equipment:   Intra-op Plan:   Post-operative Plan:   Informed Consent: I have reviewed the patients History and Physical, chart, labs and discussed the procedure including the risks, benefits and alternatives for the proposed anesthesia with the patient or authorized representative who has indicated his/her understanding and acceptance.     Dental advisory given  Plan Discussed with: CRNA and Surgeon  Anesthesia Plan Comments:        Anesthesia Quick Evaluation

## 2019-12-14 NOTE — Anesthesia Postprocedure Evaluation (Signed)
Anesthesia Post Note  Patient: Daniel Atkinson  Procedure(s) Performed: COLONOSCOPY WITH PROPOFOL (N/A ) POLYPECTOMY  Patient location during evaluation: PACU Anesthesia Type: General Level of consciousness: awake, oriented, awake and alert and patient cooperative Pain management: satisfactory to patient Vital Signs Assessment: post-procedure vital signs reviewed and stable Respiratory status: spontaneous breathing, respiratory function stable and nonlabored ventilation Cardiovascular status: stable Postop Assessment: no apparent nausea or vomiting Anesthetic complications: no   No complications documented.   Last Vitals:  Vitals:   12/14/19 0927 12/14/19 1032  BP: (!) 164/100 117/77  Pulse: 98 91  Resp: 19 16  Temp: 36.6 C 36.4 C  SpO2: 98% 99%    Last Pain:  Vitals:   12/14/19 1032  TempSrc: Oral  PainSc: 0-No pain                 Paizlee Kinder

## 2019-12-14 NOTE — Transfer of Care (Signed)
Immediate Anesthesia Transfer of Care Note  Patient: Daniel Atkinson  Procedure(s) Performed: COLONOSCOPY WITH PROPOFOL (N/A ) POLYPECTOMY  Patient Location: PACU  Anesthesia Type:General  Level of Consciousness: awake, alert , oriented and patient cooperative  Airway & Oxygen Therapy: Patient Spontanous Breathing  Post-op Assessment: Report given to RN, Post -op Vital signs reviewed and stable and Patient moving all extremities X 4  Post vital signs: Reviewed and stable  Last Vitals:  Vitals Value Taken Time  BP    Temp    Pulse    Resp    SpO2      Last Pain:  Vitals:   12/14/19 1008  TempSrc:   PainSc: 0-No pain      Patients Stated Pain Goal: 10 (38/45/36 4680)  Complications: No complications documented.

## 2019-12-14 NOTE — H&P (Signed)
@LOGO @   Primary Care Physician:  Redmond School, MD Primary Gastroenterologist:  Dr. Gala Romney  Pre-Procedure History & Physical: HPI:  Daniel Atkinson is a 54 y.o. male here for a surveillance colonoscopy.  History of a sending colonic adenoma removed 2016.  No bowel symptoms currently. \   Past Medical History:  Diagnosis Date  . Anxiety   . Cancer [facility])    prostate  . Essential hypertension   . GERD (gastroesophageal reflux disease)   . Headache   . Hiatal hernia     Past Surgical History:  Procedure Laterality Date  . APPENDECTOMY    . BIOPSY N/A 11/22/2014   Procedure: BIOPSY;  Surgeon: Daneil Dolin, MD;  Location: [facility];  Service: Endoscopy;  Laterality: N/A;  gastric,   . COLONOSCOPY WITH PROPOFOL N/A 11/22/2014   RMR: single colonic polyp removed, tubular adenoma  . ESOPHAGOGASTRODUODENOSCOPY (EGD) WITH PROPOFOL N/A 11/22/2014   Dr. Alechia Lezama:abnormal  gastric mucosa of doubtful clinical significance status post biopsy. mild chronic gastritis.   Marland Kitchen POLYPECTOMY N/A 11/22/2014   Procedure: POLYPECTOMY;  Surgeon: Daneil Dolin, MD;  Location: [facility];  Service: Endoscopy;  Laterality: N/A;  ascending colon     Prior to Admission medications   Medication Sig Start Date End Date Taking? Authorizing Provider  ALPRAZolam Duanne Moron) 1 MG tablet Take 1 mg by mouth at bedtime. 09/29/19  Yes [provider]  Cholecalciferol (VITAMIN D) 2000 units CAPS Take 2,000 Units by mouth daily.    Yes [provider]  dexlansoprazole (DEXILANT) 60 MG capsule TAKE 1 CAPSULE(60 MG) BY MOUTH DAILY before breakfast Patient taking differently: Take 60 mg by mouth daily before breakfast.  02/14/19  Yes Mahala Menghini, PA-C  docusate sodium (COLACE) 100 MG capsule Take 100 mg by mouth daily.    Yes [provider]  lisinopril (PRINIVIL,ZESTRIL) 10 MG tablet Take 10 mg by mouth daily at 12 noon.  08/07/14  Yes [provider]  Multiple Vitamin (MULTIVITAMIN WITH MINERALS)  TABS tablet Take 1 tablet by mouth daily.   Yes [provider]  polyethylene glycol (MIRALAX / GLYCOLAX) 17 g packet Take 17 g by mouth daily as needed (constipation.).   Yes [provider]  sucralfate (CARAFATE) 1 g tablet TAKE 1 TABLET BY MOUTH FOUR TIMES DAILY BEFORE MEALS AND AT BEDTIME AS NEEDED FOR REFLUX Patient taking differently: Take 1 g by mouth 4 (four) times daily as needed (reflux.).  03/02/18  Yes Mahala Menghini, PA-C  POTASSIUM PO Take 1 tablet by mouth daily as needed (cramps.).    [provider]    Allergies as of 11/02/2019 - Review Complete 11/02/2019  Allergen Reaction Noted  . Hydrocodone Nausea And Vomiting 08/09/2014    Family History  Problem Relation Age of Onset  . Hypertension Mother   . Hypertension Father   . CAD Father        Died age 12  . Colon cancer Neg Hx     Social History   Socioeconomic History  . Marital status: Married    Spouse name: Not on file  . Number of children: Not on file  . Years of education: Not on file  . Highest education level: Not on file  Occupational History  . Not on file  Tobacco Use  . Smoking status: Former Smoker    Packs/day: 1.00    Years: 15.00    Pack years: 15.00    Types: Cigarettes    Quit date:  11/15/2002    Years since quitting: 17.0  . Smokeless tobacco: Never Used  . Tobacco comment: Quit x 12 years  Vaping Use  . Vaping Use: Never used  Substance and Sexual Activity  . Alcohol use: No    Alcohol/week: 0.0 standard drinks  . Drug use: No  . Sexual activity: Yes    Birth control/protection: None  Other Topics Concern  . Not on file  Social History Narrative  . Not on file   Social Determinants of Health   Financial Resource Strain:   . Difficulty of Paying Living Expenses: Not on file  Food Insecurity:   . Worried About Charity fundraiser in the Last Year: Not on file  . Ran Out of Food in the Last Year: Not on file  Transportation Needs:   . Lack of  Transportation (Medical): Not on file  . Lack of Transportation (Non-Medical): Not on file  Physical Activity:   . Days of Exercise per Week: Not on file  . Minutes of Exercise per Session: Not on file  Stress:   . Feeling of Stress : Not on file  Social Connections:   . Frequency of Communication with Friends and Family: Not on file  . Frequency of Social Gatherings with Friends and Family: Not on file  . Attends Religious Services: Not on file  . Active Member of Clubs or Organizations: Not on file  . Attends Archivist Meetings: Not on file  . Marital Status: Not on file  Intimate Partner Violence:   . Fear of Current or Ex-Partner: Not on file  . Emotionally Abused: Not on file  . Physically Abused: Not on file  . Sexually Abused: Not on file    Review of Systems: See HPI, otherwise negative ROS  Physical Exam: BP (!) 164/100   Pulse 98   Temp 97.8 F (36.6 C) (Oral)   Resp 19   Ht 5\' 9"  (1.753 m)   Wt 68 kg   SpO2 98%   BMI 22.15 kg/m  General:   Alert,  Well-developed, well-nourished, pleasant and cooperative in NAD Neck:  Supple; no masses or thyromegaly. No significant cervical adenopathy. Lungs:  Clear throughout to auscultation.   No wheezes, crackles, or rhonchi. No acute distress. Heart:  Regular rate and rhythm; no murmurs, clicks, rubs,  or gallops. Abdomen: Non-distended, normal bowel sounds.  Soft and nontender without appreciable mass or hepatosplenomegaly.  Pulses:  Normal pulses noted. Extremities:  Without clubbing or edema.  Impression/Plan: 54 year old gentleman here for surveillance colonoscopy.  History of colonic adenoma.  Here for surveillance colonoscopy per plan.  The risks, benefits, limitations, alternatives and imponderables have been reviewed with the patient. Questions have been answered. All parties are agreeable.      Notice: This dictation was prepared with Dragon dictation along with smaller phrase technology. Any  transcriptional errors that result from this process are unintentional and may not be corrected upon review.

## 2019-12-15 LAB — SURGICAL PATHOLOGY

## 2019-12-18 ENCOUNTER — Encounter: Payer: Self-pay | Admitting: Internal Medicine

## 2019-12-20 ENCOUNTER — Encounter (HOSPITAL_COMMUNITY): Payer: Self-pay | Admitting: Internal Medicine

## 2020-03-06 DIAGNOSIS — G4709 Other insomnia: Secondary | ICD-10-CM | POA: Diagnosis not present

## 2020-03-09 ENCOUNTER — Other Ambulatory Visit: Payer: Self-pay | Admitting: Urology

## 2020-03-09 ENCOUNTER — Other Ambulatory Visit: Payer: Self-pay | Admitting: Gastroenterology

## 2020-03-09 DIAGNOSIS — C61 Malignant neoplasm of prostate: Secondary | ICD-10-CM

## 2020-03-11 ENCOUNTER — Telehealth: Payer: Self-pay

## 2020-03-11 DIAGNOSIS — H40013 Open angle with borderline findings, low risk, bilateral: Secondary | ICD-10-CM | POA: Diagnosis not present

## 2020-03-11 NOTE — Telephone Encounter (Signed)
Pt walked in office asking for a Dexilant 60 mg sample. 1 box of Dexilant 60 mg was given to pt. Pt is waiting for his refill to be sent to his pharmacy. Please send refill.

## 2020-03-12 NOTE — Telephone Encounter (Signed)
Please let patient know Rx for Dexilant has been refilled.

## 2020-03-12 NOTE — Telephone Encounter (Signed)
Noted. Tried calling pt to inform him medication was sent into his pharmacy. When pt was in the office, I informed him that his medication would be sent into his pharmacy.

## 2020-04-10 DIAGNOSIS — Z6823 Body mass index (BMI) 23.0-23.9, adult: Secondary | ICD-10-CM | POA: Diagnosis not present

## 2020-04-10 DIAGNOSIS — C61 Malignant neoplasm of prostate: Secondary | ICD-10-CM | POA: Diagnosis not present

## 2020-04-18 ENCOUNTER — Telehealth: Payer: Self-pay

## 2020-04-18 NOTE — Telephone Encounter (Signed)
Pt aware of appt on 2/1.

## 2020-04-18 NOTE — Telephone Encounter (Signed)
-----   Message from Franchot Gallo, MD sent at 04/15/2020  9:49 AM EST -----  Have not seen this man in over a year.  He needs follow-up.

## 2020-04-23 ENCOUNTER — Encounter: Payer: Self-pay | Admitting: Urology

## 2020-04-23 ENCOUNTER — Other Ambulatory Visit: Payer: Self-pay

## 2020-04-23 ENCOUNTER — Ambulatory Visit (INDEPENDENT_AMBULATORY_CARE_PROVIDER_SITE_OTHER): Payer: BC Managed Care – PPO | Admitting: Urology

## 2020-04-23 VITALS — BP 153/96 | HR 101 | Temp 98.3°F | Ht 69.0 in | Wt 157.0 lb

## 2020-04-23 DIAGNOSIS — C61 Malignant neoplasm of prostate: Secondary | ICD-10-CM | POA: Diagnosis not present

## 2020-04-23 LAB — MICROSCOPIC EXAMINATION
Renal Epithel, UA: NONE SEEN /hpf
WBC, UA: NONE SEEN /hpf (ref 0–5)

## 2020-04-23 LAB — URINALYSIS, ROUTINE W REFLEX MICROSCOPIC
Bilirubin, UA: NEGATIVE
Glucose, UA: NEGATIVE
Leukocytes,UA: NEGATIVE
Nitrite, UA: NEGATIVE
Specific Gravity, UA: 1.02 (ref 1.005–1.030)
Urobilinogen, Ur: >8 mg/dL — ABNORMAL HIGH (ref 0.2–1.0)
pH, UA: 7 (ref 5.0–7.5)

## 2020-04-23 NOTE — Progress Notes (Signed)
H&P  Chief Complaint: Hx of PCa & ED  History of Present Illness:   2.1.2022: Daniel Atkinson is here for refill of his ED medication.  He uses 100 mg of sildenafil at a time.   He has continued his urologic under that care of Dr. Adella Nissen for DRE & PSA. He denies any blood per urine or stool.  IPSS Questionnaire (AUA-7): Over the past month.   1)  How often have you had a sensation of not emptying your bladder completely after you finish urinating?  0 - Not at all  2)  How often have you had to urinate again less than two hours after you finished urinating? 1 - Less than 1 time in 5  3)  How often have you found you stopped and started again several times when you urinated?  0 - Not at all  4) How difficult have you found it to postpone urination?  1 - Less than 1 time in 5  5) How often have you had a weak urinary stream?  0 - Not at all  6) How often have you had to push or strain to begin urination?  0 - Not at all  7) How many times did you most typically get up to urinate from the time you went to bed until the time you got up in the morning?  1 - 1 time  Total score:  0-7 mildly symptomatic   8-19 moderately symptomatic   20-35 severely symptomatic  IPSS: 3 QoL Score: 2   (below copied from [facility] records):  CC: I have prostate cancer (treatment).  HPI: Daniel Atkinson is a 55 year-old male established patient who is here for prostate cancer which has been treated.  TRUS/Bx on 12.3.2019. PSA 3.8, prostatic volume 15 mL, PSA density 0.25. 7/12 cores were positive for adenocarcinoma:  4 cores revealed GS 3+3 pattern (right base lateral, right mid lateral, right apex medial, left apex medial)  3 cores revealed GS 3+4 pattern (left base lateral, left mid medial, left apex lateral)   IPSS--13 quality of Life score 4  Shim score-- 14   3.2.2020: He is here for placement of fiducial markers and SpaceOAR.   5.20.2020: Completed 44 EBRT treatments. He did get dysuria from treatments. Flomax  given, helped sx's.   10.6.2020: He has not had recent PSA. He denies any significant urinary issues since completing radiation. He denies any blood per urine but he temporarily discontinued ulcer medication and had some blood per stool.  Following this visit he has been seen the radiation oncologist in [place] for his cancer follow-up.      Past Medical History:  Diagnosis Date  . Anxiety   . Cancer [facility])    prostate  . Essential hypertension   . GERD (gastroesophageal reflux disease)   . Headache   . Hiatal hernia     Past Surgical History:  Procedure Laterality Date  . APPENDECTOMY    . BIOPSY N/A 11/22/2014   Procedure: BIOPSY;  Surgeon: Daneil Dolin, MD;  Location: [facility];  Service: Endoscopy;  Laterality: N/A;  gastric,   . COLONOSCOPY WITH PROPOFOL N/A 11/22/2014   RMR: single colonic polyp removed, tubular adenoma  . COLONOSCOPY WITH PROPOFOL N/A 12/14/2019   Procedure: COLONOSCOPY WITH PROPOFOL;  Surgeon: Daneil Dolin, MD;  Location: [facility];  Service: Endoscopy;  Laterality: N/A;  10:30am  . ESOPHAGOGASTRODUODENOSCOPY (EGD) WITH PROPOFOL N/A 11/22/2014   Dr. Rourk:abnormal  gastric mucosa of doubtful clinical significance  status post biopsy. mild chronic gastritis.   Marland Kitchen POLYPECTOMY N/A 11/22/2014   Procedure: POLYPECTOMY;  Surgeon: Daneil Dolin, MD;  Location: [facility];  Service: Endoscopy;  Laterality: N/A;  ascending colon   . POLYPECTOMY  12/14/2019   Procedure: POLYPECTOMY;  Surgeon: Daneil Dolin, MD;  Location: [facility];  Service: Endoscopy;;    Home Medications:  Allergies as of 04/23/2020      Reactions   Hydrocodone Nausea And Vomiting      Medication List       Accurate as of April 23, 2020 11:21 AM. If you have any questions, ask your nurse or doctor.        ALPRAZolam 1 MG tablet Commonly known as: XANAX Take 1 mg by mouth at bedtime.   Dexilant 60 MG capsule Generic drug: dexlansoprazole Take 1 capsule (60 mg total) by mouth daily  before breakfast.   docusate sodium 100 MG capsule Commonly known as: COLACE Take 100 mg by mouth daily.   lisinopril 10 MG tablet Commonly known as: ZESTRIL Take 10 mg by mouth daily at 12 noon.   multivitamin with minerals Tabs tablet Take 1 tablet by mouth daily.   polyethylene glycol 17 g packet Commonly known as: MIRALAX / GLYCOLAX Take 17 g by mouth daily as needed (constipation.).   POTASSIUM PO Take 1 tablet by mouth daily as needed (cramps.).   sucralfate 1 g tablet Commonly known as: CARAFATE TAKE 1 TABLET BY MOUTH FOUR TIMES DAILY BEFORE MEALS AND AT BEDTIME AS NEEDED FOR REFLUX What changed:   how much to take  how to take this  when to take this  reasons to take this  additional instructions   Vitamin D 50 MCG (2000 UT) Caps Take 2,000 Units by mouth daily.       Allergies:  Allergies  Allergen Reactions  . Hydrocodone Nausea And Vomiting    Family History  Problem Relation Age of Onset  . Hypertension Mother   . Hypertension Father   . CAD Father        Died age 2  . Colon cancer Neg Hx     Social History:  reports that he quit smoking about 17 years ago. His smoking use included cigarettes. He has a 15.00 pack-year smoking history. He has never used smokeless tobacco. He reports that he does not drink alcohol and does not use drugs.  ROS: A complete review of systems was performed.  All systems are negative except for pertinent findings as noted.  Physical Exam:  Vital signs in last 24 hours: BP (!) 153/96   Pulse (!) 101   Temp 98.3 F (36.8 C)   Ht 5\' 9"  (1.753 m)   Wt 157 lb (71.2 kg)   BMI 23.18 kg/m  Constitutional:  Alert and oriented, No acute distress Cardiovascular: Regular rate  Neurologic: Grossly intact, no focal deficits Psychiatric: Normal mood and affect  I have reviewed prior pt notes  I have reviewed notes from referring/previous physicians  I have reviewed urinalysis results  I have reviewed prior  PSA/pathology results   Impression/Assessment:  ED: Pt symptoms are well managed on sildenafil.  Hx PCa: NED at this time--followed by Dr. Adella Nissen in [place]:  1. Pt refilled on sildenafil.  2. F/U in 2 years for OV and symptom recheck.  CC: Drs. [place]

## 2020-04-23 NOTE — Progress Notes (Signed)
Urological Symptom Review ° °Patient is experiencing the following symptoms: °Erection problems (male only) ° ° °Review of Systems ° °Gastrointestinal (upper)  : °Negative for upper GI symptoms ° °Gastrointestinal (lower) : °Negative for lower GI symptoms ° °Constitutional : °Negative for symptoms ° °Skin: °Skin rash/lesion ° °Eyes: °Negative for eye symptoms ° °Ear/Nose/Throat : °Negative for Ear/Nose/Throat symptoms ° °Hematologic/Lymphatic: °Negative for Hematologic/Lymphatic symptoms ° °Cardiovascular : °Negative for cardiovascular symptoms ° °Respiratory : °Negative for respiratory symptoms ° °Endocrine: °Negative for endocrine symptoms ° °Musculoskeletal: °Negative for musculoskeletal symptoms ° °Neurological: °Negative for neurological symptoms ° °Psychologic: °Negative for psychiatric symptoms °

## 2020-06-17 DIAGNOSIS — Z6823 Body mass index (BMI) 23.0-23.9, adult: Secondary | ICD-10-CM | POA: Diagnosis not present

## 2020-06-17 DIAGNOSIS — G4709 Other insomnia: Secondary | ICD-10-CM | POA: Diagnosis not present

## 2020-06-17 DIAGNOSIS — Z1331 Encounter for screening for depression: Secondary | ICD-10-CM | POA: Diagnosis not present

## 2020-08-28 DIAGNOSIS — G4700 Insomnia, unspecified: Secondary | ICD-10-CM | POA: Diagnosis not present

## 2020-08-28 DIAGNOSIS — I10 Essential (primary) hypertension: Secondary | ICD-10-CM | POA: Diagnosis not present

## 2020-09-03 DIAGNOSIS — Z0000 Encounter for general adult medical examination without abnormal findings: Secondary | ICD-10-CM | POA: Diagnosis not present

## 2020-09-03 DIAGNOSIS — Z6823 Body mass index (BMI) 23.0-23.9, adult: Secondary | ICD-10-CM | POA: Diagnosis not present

## 2020-10-02 DIAGNOSIS — C61 Malignant neoplasm of prostate: Secondary | ICD-10-CM | POA: Diagnosis not present

## 2020-10-10 DIAGNOSIS — R9721 Rising PSA following treatment for malignant neoplasm of prostate: Secondary | ICD-10-CM | POA: Diagnosis not present

## 2020-10-10 DIAGNOSIS — C61 Malignant neoplasm of prostate: Secondary | ICD-10-CM | POA: Diagnosis not present

## 2020-10-10 DIAGNOSIS — N529 Male erectile dysfunction, unspecified: Secondary | ICD-10-CM | POA: Diagnosis not present

## 2020-10-10 DIAGNOSIS — Z6823 Body mass index (BMI) 23.0-23.9, adult: Secondary | ICD-10-CM | POA: Diagnosis not present

## 2020-10-21 ENCOUNTER — Encounter: Payer: Self-pay | Admitting: Internal Medicine

## 2020-11-04 DIAGNOSIS — H40013 Open angle with borderline findings, low risk, bilateral: Secondary | ICD-10-CM | POA: Diagnosis not present

## 2021-01-28 ENCOUNTER — Other Ambulatory Visit: Payer: Self-pay | Admitting: Gastroenterology

## 2021-01-29 DIAGNOSIS — G4709 Other insomnia: Secondary | ICD-10-CM | POA: Diagnosis not present

## 2021-04-09 DIAGNOSIS — C61 Malignant neoplasm of prostate: Secondary | ICD-10-CM | POA: Diagnosis not present

## 2021-04-15 DIAGNOSIS — C61 Malignant neoplasm of prostate: Secondary | ICD-10-CM | POA: Diagnosis not present

## 2021-08-04 DIAGNOSIS — G4709 Other insomnia: Secondary | ICD-10-CM | POA: Diagnosis not present

## 2021-08-04 DIAGNOSIS — I10 Essential (primary) hypertension: Secondary | ICD-10-CM | POA: Diagnosis not present

## 2021-08-05 DIAGNOSIS — H40022 Open angle with borderline findings, high risk, left eye: Secondary | ICD-10-CM | POA: Diagnosis not present

## 2021-09-03 DIAGNOSIS — Z1331 Encounter for screening for depression: Secondary | ICD-10-CM | POA: Diagnosis not present

## 2021-09-03 DIAGNOSIS — K2100 Gastro-esophageal reflux disease with esophagitis, without bleeding: Secondary | ICD-10-CM | POA: Diagnosis not present

## 2021-09-03 DIAGNOSIS — Z6822 Body mass index (BMI) 22.0-22.9, adult: Secondary | ICD-10-CM | POA: Diagnosis not present

## 2021-09-03 DIAGNOSIS — I10 Essential (primary) hypertension: Secondary | ICD-10-CM | POA: Diagnosis not present

## 2021-09-03 DIAGNOSIS — Z0001 Encounter for general adult medical examination with abnormal findings: Secondary | ICD-10-CM | POA: Diagnosis not present

## 2021-09-03 DIAGNOSIS — C61 Malignant neoplasm of prostate: Secondary | ICD-10-CM | POA: Diagnosis not present

## 2021-10-10 ENCOUNTER — Telehealth: Payer: Self-pay | Admitting: *Deleted

## 2021-10-10 NOTE — Chronic Care Management (AMB) (Unsigned)
  Care Coordination  Note  10/10/2021 Name: Daniel Atkinson MRN: [id number] DOB: 1965-06-12  Daniel Atkinson is a 56 y.o. year old male who is a primary care patient of Redmond School, MD. I reached out to Daniel Atkinson by phone today to offer care coordination services.       Follow up plan: Unsuccessful telephone outreach attempt made. A HIPAA compliant phone message was left for the patient providing contact information and requesting a return call.  The care guide will reach out to the patient again over the next 7 days.  If patient calls provider office to request assistance with care coordination needs, please contact the care guide at the number below.   [place]  Direct Dial: [phone]

## 2021-10-13 NOTE — Chronic Care Management (AMB) (Signed)
  Care Coordination  Note  10/13/2021 Name: Daniel Atkinson MRN: [id number] DOB: Sep 19, 1965  Daniel Atkinson is a 56 y.o. year old male who is a primary care patient of Redmond School, MD. I reached out to [name] by phone today to offer care coordination services.      Mr. Bellina was given information about Care Coordination services today including:  The Care Coordination services include support from the care team which includes your Nurse Coordinator, Clinical Social Worker, or Pharmacist.  The Care Coordination team is here to help remove barriers to the health concerns and goals most important to you. Care Coordination services are voluntary and the patient may decline or stop services at any time by request to their care team member.   Patient did not agree to participate in care coordination services at this time.  Follow up plan: Patient declines participation in care coordination services.   [place]  Direct Dial: [phone]

## 2021-10-14 DIAGNOSIS — C61 Malignant neoplasm of prostate: Secondary | ICD-10-CM | POA: Diagnosis not present

## 2021-10-15 DIAGNOSIS — C61 Malignant neoplasm of prostate: Secondary | ICD-10-CM | POA: Diagnosis not present

## 2021-10-29 DIAGNOSIS — I10 Essential (primary) hypertension: Secondary | ICD-10-CM | POA: Diagnosis not present

## 2021-10-29 DIAGNOSIS — C61 Malignant neoplasm of prostate: Secondary | ICD-10-CM | POA: Diagnosis not present

## 2021-10-29 DIAGNOSIS — Z6821 Body mass index (BMI) 21.0-21.9, adult: Secondary | ICD-10-CM | POA: Diagnosis not present

## 2021-10-29 DIAGNOSIS — K2980 Duodenitis without bleeding: Secondary | ICD-10-CM | POA: Diagnosis not present

## 2021-11-11 DIAGNOSIS — H401221 Low-tension glaucoma, left eye, mild stage: Secondary | ICD-10-CM | POA: Diagnosis not present

## 2021-12-15 DIAGNOSIS — H401221 Low-tension glaucoma, left eye, mild stage: Secondary | ICD-10-CM | POA: Diagnosis not present

## 2022-01-07 DIAGNOSIS — H401221 Low-tension glaucoma, left eye, mild stage: Secondary | ICD-10-CM | POA: Diagnosis not present

## 2022-01-19 ENCOUNTER — Other Ambulatory Visit: Payer: Self-pay | Admitting: Gastroenterology

## 2022-01-19 NOTE — Telephone Encounter (Signed)
Needs routine office visit. I have refilled Dexilant in meantime.

## 2022-02-04 DIAGNOSIS — I10 Essential (primary) hypertension: Secondary | ICD-10-CM | POA: Diagnosis not present

## 2022-02-04 DIAGNOSIS — F419 Anxiety disorder, unspecified: Secondary | ICD-10-CM | POA: Diagnosis not present

## 2022-02-05 ENCOUNTER — Ambulatory Visit (INDEPENDENT_AMBULATORY_CARE_PROVIDER_SITE_OTHER): Payer: BC Managed Care – PPO | Admitting: Gastroenterology

## 2022-02-05 ENCOUNTER — Encounter: Payer: Self-pay | Admitting: Gastroenterology

## 2022-02-05 VITALS — BP 172/93 | HR 80 | Temp 97.9°F | Ht 69.0 in | Wt 153.0 lb

## 2022-02-05 DIAGNOSIS — K219 Gastro-esophageal reflux disease without esophagitis: Secondary | ICD-10-CM | POA: Diagnosis not present

## 2022-02-05 MED ORDER — DEXILANT 60 MG PO CPDR: 60.0000 mg | DELAYED_RELEASE_CAPSULE | Freq: Every day | ORAL | 3 refills | Status: DC

## 2022-02-05 NOTE — Patient Instructions (Addendum)
Continue Dexilant daily. I requested it to be name brand, so let me know if this does not happen!  We will see you back in 1 year!  Your next colonoscopy is due in 2026.  Your repeat blood pressure was 172/93. Please call your primary care to follow-up for blood pressure control.     I enjoyed seeing you again today! As you know, I value our relationship and want to provide genuine, compassionate, and quality care. I welcome your feedback. If you receive a survey regarding your visit,  I greatly appreciate you taking time to fill this out. See you next time!  Annitta Needs, PhD, ANP-BC [facility]

## 2022-02-05 NOTE — Progress Notes (Addendum)
Gastroenterology Office Note     Primary Care Physician:  Redmond School, MD  Primary Gastroenterologist: Dr. Gala Romney   Chief Complaint   Chief Complaint  Patient presents with   Follow-up    GERD and med refills     History of Present Illness   Daniel Atkinson is a 56 y.o. male presenting today in follow-up with a history of chronic GERD, constipation, adenomas with next surveillance in 2026. Last seen in Aug 2021.   Can tell a difference with generic Dexilant instead of name brand. GERD flares on generic. No dysphagia. No constipation/diarrhea. No rectal bleeding. No unexplained weight loss or lack of appetite.   No other concerns today.      Past Medical History:  Diagnosis Date   Anxiety    Cancer [facility])    prostate   Essential hypertension    GERD (gastroesophageal reflux disease)    Headache    Hiatal hernia     Past Surgical History:  Procedure Laterality Date   APPENDECTOMY     BIOPSY N/A 11/22/2014   Procedure: BIOPSY;  Surgeon: Daneil Dolin, MD;  Location: [facility];  Service: Endoscopy;  Laterality: N/A;  gastric,    COLONOSCOPY WITH PROPOFOL N/A 11/22/2014   RMR: single colonic polyp removed, tubular adenoma   COLONOSCOPY WITH PROPOFOL N/A 12/14/2019   one 5 mm polyp (tubular adenoma). 5 year surveillance.   ESOPHAGOGASTRODUODENOSCOPY (EGD) WITH PROPOFOL N/A 11/22/2014   Dr. Rourk:abnormal  gastric mucosa of doubtful clinical significance status post biopsy. mild chronic gastritis.    POLYPECTOMY N/A 11/22/2014   Procedure: POLYPECTOMY;  Surgeon: Daneil Dolin, MD;  Location: [facility];  Service: Endoscopy;  Laterality: N/A;  ascending colon    POLYPECTOMY  12/14/2019   Procedure: POLYPECTOMY;  Surgeon: Daneil Dolin, MD;  Location: [facility];  Service: Endoscopy;;    Current Outpatient Medications  Medication Sig Dispense Refill   ALPRAZolam (XANAX) 1 MG tablet Take 1 mg by mouth at bedtime.     bimatoprost (LUMIGAN) 0.01 % SOLN 1  drop at bedtime.     DEXILANT 60 MG capsule Take 1 capsule (60 mg total) by mouth daily. 90 capsule 3   docusate sodium (COLACE) 100 MG capsule Take 100 mg by mouth daily.     lisinopril (ZESTRIL) 20 MG tablet Take 20 mg by mouth daily.     Multiple Vitamin (MULTIVITAMIN WITH MINERALS) TABS tablet Take 1 tablet by mouth daily.     polyethylene glycol (MIRALAX / GLYCOLAX) 17 g packet Take 17 g by mouth daily as needed (constipation.).     sucralfate (CARAFATE) 1 g tablet TAKE 1 TABLET BY MOUTH FOUR TIMES DAILY BEFORE MEALS AND AT BEDTIME AS NEEDED FOR REFLUX (Patient taking differently: Take 1 g by mouth 4 (four) times daily as needed (reflux.).) 360 tablet 0   Cholecalciferol (VITAMIN D) 2000 units CAPS Take 2,000 Units by mouth daily.  (Patient not taking: Reported on 02/05/2022)     lisinopril (PRINIVIL,ZESTRIL) 10 MG tablet Take 10 mg by mouth daily at 12 noon.  (Patient not taking: Reported on 02/05/2022)  5   POTASSIUM PO Take 1 tablet by mouth daily as needed (cramps.). (Patient not taking: Reported on 02/05/2022)     No current facility-administered medications for this visit.    Allergies as of 02/05/2022 - Review Complete 02/05/2022  Allergen Reaction Noted   Hydrocodone Nausea And Vomiting 08/09/2014    Family History  Problem Relation  Age of Onset   Hypertension Mother    Hypertension Father    CAD Father        Died age 63   Colon cancer Neg Hx     Social History   Socioeconomic History   Marital status: Married    Spouse name: Not on file   Number of children: Not on file   Years of education: Not on file   Highest education level: Not on file  Occupational History   Not on file  Tobacco Use   Smoking status: Former    Packs/day: 1.00    Years: 15.00    Total pack years: 15.00    Types: Cigarettes    Quit date: 11/15/2002    Years since quitting: 19.2   Smokeless tobacco: Never   Tobacco comments:    Quit x 12 years  Vaping Use   Vaping Use: Never used   Substance and Sexual Activity   Alcohol use: No    Alcohol/week: 0.0 standard drinks of alcohol   Drug use: No   Sexual activity: Yes    Birth control/protection: None  Other Topics Concern   Not on file  Social History Narrative   Not on file   Social Determinants of Health   Financial Resource Strain: Not on file  Food Insecurity: Not on file  Transportation Needs: Not on file  Physical Activity: Not on file  Stress: Not on file  Social Connections: Not on file  Intimate Partner Violence: Not on file     Review of Systems   Gen: Denies any fever, chills, fatigue, weight loss, lack of appetite.  CV: Denies chest pain, heart palpitations, peripheral edema, syncope.  Resp: Denies shortness of breath at rest or with exertion. Denies wheezing or cough.  GI:see HPI GU : Denies urinary burning, urinary frequency, urinary hesitancy MS: Denies joint pain, muscle weakness, cramps, or limitation of movement.  Derm: Denies rash, itching, dry skin Psych: Denies depression, anxiety, memory loss, and confusion Heme: Denies bruising, bleeding, and enlarged lymph nodes.   Physical Exam   BP (!) 172/93   Pulse 80   Temp 97.9 F (36.6 C)   Ht '5\' 9"'$  (1.753 m)   Wt 153 lb (69.4 kg)   BMI 22.59 kg/m  General:   Alert and oriented. Pleasant and cooperative. Well-nourished and well-developed.  Head:  Normocephalic and atraumatic. Eyes:  Without icterus Abdomen:  +BS, soft, non-tender and non-distended. No HSM noted. No guarding or rebound. No masses appreciated.  Rectal:  Deferred  Msk:  Symmetrical without gross deformities. Normal posture. Extremities:  Without edema. Neurologic:  Alert and  oriented x4;  grossly normal neurologically. Skin:  Intact without significant lesions or rashes. Psych:  Alert and cooperative. Normal mood and affect.   Assessment   Daniel Atkinson is a 56 y.o. male presenting today in follow-up with a history of chronic GERD, constipation,  adenomas with next surveillance in 2026.   GERD: chronically on Dexilant but recently changed to generic version, which is not as efficacious per patient. I have sent in a new prescription and requested brand only.   Constipation: no concerns.   Adenomas: colonoscopy due in 2026.   The patient was found to have elevated blood pressure when vital signs were checked in the office. The blood pressure was rechecked by the nursing staff, and it was found to be persistently elevated >140/90 mmHg. I personally advised the patient to follow up closely with the PCP for hypertension  control.      PLAN    Refilled brand name Dexilant Return in 1 year Colonoscopy in 2026   Annitta Needs, PhD, ANP-BC [facility]

## 2022-02-05 NOTE — Addendum Note (Signed)
Addended by: Annitta Needs on: 02/05/2022 10:18 AM   Modules accepted: Orders

## 2022-04-01 ENCOUNTER — Telehealth: Payer: Self-pay | Admitting: *Deleted

## 2022-04-01 NOTE — Progress Notes (Signed)
  Care Coordination   Note   04/01/2022 Name: Daniel Atkinson MRN: [id number] DOB: 11-15-1965  Daniel Atkinson is a 57 y.o. year old male who sees Redmond School, MD for primary care. I reached out to [name] by phone today to offer care coordination services.  [name] was given information about Care Coordination services today including:   The Care Coordination services include support from the care team which includes your Nurse Coordinator, Clinical Social Worker, or Pharmacist.  The Care Coordination team is here to help remove barriers to the health concerns and goals most important to you. Care Coordination services are voluntary, and the patient may decline or stop services at any time by request to their care team member.   Care Coordination Consent Status: Patient agreed to services and verbal consent obtained.   Follow up plan:  Telephone appointment with care coordination team member scheduled for:  04/06/22  Encounter Outcome:  Pt. Scheduled  [place]  Direct Dial: [phone]

## 2022-04-06 ENCOUNTER — Ambulatory Visit: Payer: Self-pay | Admitting: *Deleted

## 2022-04-06 ENCOUNTER — Encounter: Payer: BC Managed Care – PPO | Admitting: *Deleted

## 2022-04-06 ENCOUNTER — Encounter: Payer: Self-pay | Admitting: *Deleted

## 2022-04-06 NOTE — Patient Outreach (Signed)
  Care Coordination   Initial Visit Note   04/06/2022 Name: Daniel Atkinson MRN: [id number] DOB: 08/29/1965  Daniel Atkinson is a 57 y.o. year old male who sees Redmond School, MD for primary care. I spoke with  [name] by phone today. Patient was on the segmented list but not on the APL. Care Guide didn't realize until after appointment was made and instead of having her call back and cancel, I agreed to do the initial outreach and forward to [name] for follow-up if any needs were identified.   What matters to the patients health and wellness today? Ongoing self management of chronic medical conditions. No current resource or care coordination needs identified.    SDOH assessments and interventions completed:  Yes  SDOH Interventions Today    Flowsheet Row Most Recent Value  SDOH Interventions   Food Insecurity Interventions Intervention Not Indicated  Transportation Interventions Intervention Not Indicated  Financial Strain Interventions Intervention Not Indicated        Care Coordination Interventions:  Yes, provided. Explained the reason for my call and encouraged to follow-up with [facility] and Nicholas Lose, PharmD ([facility]) with any Care Coordination, resource, or medication needs.    Follow up plan: No further intervention required.   Encounter Outcome:  Pt. Visit Completed   Chong Sicilian, BSN, RN-BC RN Care Coordinator [place] Direct Dial: [phone] Main #: [phone]

## 2022-04-06 NOTE — Progress Notes (Signed)
History of Present Illness:   TRUS/Bx on 12.3.2019. PSA 3.8, prostatic volume 15 mL, PSA density 0.25. 7/12 cores were positive for adenocarcinoma:  4 cores revealed GS 3+3 pattern (right base lateral, right mid lateral, right apex medial, left apex medial)  3 cores revealed GS 3+4 pattern (left base lateral, left mid medial, left apex lateral)   IPSS--13 quality of Life score 4  Shim score-- 14   3.2.2020: He is here for placement of fiducial markers and Space[employer].   5.20.2020: Completed 44 EBRT treatments.   Past Medical History:  Diagnosis Date   Anxiety    Cancer [facility])    prostate   Essential hypertension    GERD (gastroesophageal reflux disease)    Headache    Hiatal hernia     Past Surgical History:  Procedure Laterality Date   APPENDECTOMY     BIOPSY N/A 11/22/2014   Procedure: BIOPSY;  Surgeon: Daneil Dolin, MD;  Location: [facility];  Service: Endoscopy;  Laterality: N/A;  gastric,    COLONOSCOPY WITH PROPOFOL N/A 11/22/2014   RMR: single colonic polyp removed, tubular adenoma   COLONOSCOPY WITH PROPOFOL N/A 12/14/2019   one 5 mm polyp (tubular adenoma). 5 year surveillance.   ESOPHAGOGASTRODUODENOSCOPY (EGD) WITH PROPOFOL N/A 11/22/2014   Dr. Rourk:abnormal  gastric mucosa of doubtful clinical significance status post biopsy. mild chronic gastritis.    POLYPECTOMY N/A 11/22/2014   Procedure: POLYPECTOMY;  Surgeon: Daneil Dolin, MD;  Location: [facility];  Service: Endoscopy;  Laterality: N/A;  ascending colon    POLYPECTOMY  12/14/2019   Procedure: POLYPECTOMY;  Surgeon: Daneil Dolin, MD;  [facility];  Service: Endoscopy;;    Home Medications:  Allergies as of 04/07/2022       Reactions   Hydrocodone Nausea And Vomiting        Medication List        Accurate as of April 06, 2022  7:32 PM. If you have any questions, ask your nurse or doctor.          ALPRAZolam 1 MG tablet Commonly known as: XANAX Take 1 mg by mouth at bedtime.    bimatoprost 0.01 % Soln Commonly known as: LUMIGAN 1 drop at bedtime.   Dexilant 60 MG capsule Generic drug: dexlansoprazole Take 1 capsule (60 mg total) by mouth daily.   docusate sodium 100 MG capsule Commonly known as: COLACE Take 100 mg by mouth daily.   lisinopril 20 MG tablet Commonly known as: ZESTRIL Take 20 mg by mouth daily.   multivitamin with minerals Tabs tablet Take 1 tablet by mouth daily.   polyethylene glycol 17 g packet Commonly known as: MIRALAX / GLYCOLAX Take 17 g by mouth daily as needed (constipation.).   sucralfate 1 g tablet Commonly known as: CARAFATE TAKE 1 TABLET BY MOUTH FOUR TIMES DAILY BEFORE MEALS AND AT BEDTIME AS NEEDED FOR REFLUX What changed:  how much to take how to take this when to take this reasons to take this additional instructions        Allergies:  Allergies  Allergen Reactions   Hydrocodone Nausea And Vomiting    Family History  Problem Relation Age of Onset   Hypertension Mother    Hypertension Father    CAD Father        Died age 56   Colon cancer Neg Hx     Social History:  reports that he quit smoking about 19 years ago. His smoking use included cigarettes. He  has a 15.00 pack-year smoking history. He has never used smokeless tobacco. He reports that he does not drink alcohol and does not use drugs.  ROS: A complete review of systems was performed.  All systems are negative except for pertinent findings as noted.  Physical Exam:  Vital signs in last 24 hours: There were no vitals taken for this visit. Constitutional:  Alert and oriented, No acute distress Cardiovascular: Regular rate  Respiratory: Normal respiratory effort GI: Abdomen is soft, nontender, nondistended, no abdominal masses. No CVAT.  Genitourinary: Normal male phallus, testes are descended bilaterally and non-tender and without masses, scrotum is normal in appearance without lesions or masses, perineum is normal on  inspection. Lymphatic: No lymphadenopathy Neurologic: Grossly intact, no focal deficits Psychiatric: Normal mood and affect  I have reviewed prior pt notes  I have reviewed notes from referring/previous physicians  I have reviewed urinalysis results  I have independently reviewed prior imaging  I have reviewed prior PSA results  I have reviewed prior urine culture   Impression/Assessment:  ***  Plan:  ***

## 2022-04-07 ENCOUNTER — Encounter: Payer: Self-pay | Admitting: Urology

## 2022-04-07 ENCOUNTER — Ambulatory Visit: Payer: BC Managed Care – PPO | Admitting: Urology

## 2022-04-07 VITALS — BP 157/97 | HR 106

## 2022-04-07 DIAGNOSIS — Z8546 Personal history of malignant neoplasm of prostate: Secondary | ICD-10-CM

## 2022-04-07 DIAGNOSIS — N5201 Erectile dysfunction due to arterial insufficiency: Secondary | ICD-10-CM

## 2022-04-07 LAB — URINALYSIS, ROUTINE W REFLEX MICROSCOPIC
Bilirubin, UA: NEGATIVE
Glucose, UA: NEGATIVE
Ketones, UA: NEGATIVE
Leukocytes,UA: NEGATIVE
Nitrite, UA: NEGATIVE
Protein,UA: NEGATIVE
RBC, UA: NEGATIVE
Specific Gravity, UA: 1.02 (ref 1.005–1.030)
Urobilinogen, Ur: 1 mg/dL (ref 0.2–1.0)
pH, UA: 5.5 (ref 5.0–7.5)

## 2022-04-07 MED ORDER — TADALAFIL 20 MG PO TABS: 20.0000 mg | ORAL_TABLET | ORAL | 11 refills | Status: DC | PRN

## 2022-04-08 DIAGNOSIS — H401221 Low-tension glaucoma, left eye, mild stage: Secondary | ICD-10-CM | POA: Diagnosis not present

## 2022-04-08 LAB — PSA: Prostate Specific Ag, Serum: 0.6 ng/mL (ref 0.0–4.0)

## 2022-04-09 ENCOUNTER — Telehealth: Payer: Self-pay

## 2022-04-09 ENCOUNTER — Other Ambulatory Visit: Payer: Self-pay

## 2022-04-09 DIAGNOSIS — N5201 Erectile dysfunction due to arterial insufficiency: Secondary | ICD-10-CM

## 2022-04-09 MED ORDER — TADALAFIL 20 MG PO TABS: 20.0000 mg | ORAL_TABLET | ORAL | 11 refills | Status: DC | PRN

## 2022-04-09 NOTE — Progress Notes (Signed)
Cialis sent to wrong pharmacy, updated rx and patient aware.

## 2022-04-09 NOTE — Telephone Encounter (Signed)
Made patient aware that his psa 0.6--normal. Patient voiced understanding

## 2022-04-09 NOTE — Telephone Encounter (Signed)
-----  Message from Franchot Gallo, MD sent at 04/09/2022 11:16 AM EST ----- Notify pt--psa 0.6--normal ----- Message ----- From: Sherrilyn Rist, CMA Sent: 04/08/2022   8:38 AM EST To: Franchot Gallo, MD  Please review

## 2022-04-09 NOTE — Telephone Encounter (Signed)
Patient needing his   tadalafil (CIALIS) 20 MG tablet   Called into   [facility], [place] Phone: [phone]  Fax: [phone]      Thanks, Helene Kelp

## 2022-04-09 NOTE — Telephone Encounter (Signed)
Resent, patient aware.

## 2022-09-07 DIAGNOSIS — H401221 Low-tension glaucoma, left eye, mild stage: Secondary | ICD-10-CM | POA: Diagnosis not present

## 2022-09-14 DIAGNOSIS — Z6822 Body mass index (BMI) 22.0-22.9, adult: Secondary | ICD-10-CM | POA: Diagnosis not present

## 2022-09-14 DIAGNOSIS — I10 Essential (primary) hypertension: Secondary | ICD-10-CM | POA: Diagnosis not present

## 2022-09-14 DIAGNOSIS — Z1389 Encounter for screening for other disorder: Secondary | ICD-10-CM | POA: Diagnosis not present

## 2022-09-14 DIAGNOSIS — Z0001 Encounter for general adult medical examination with abnormal findings: Secondary | ICD-10-CM | POA: Diagnosis not present

## 2022-09-14 DIAGNOSIS — Z1331 Encounter for screening for depression: Secondary | ICD-10-CM | POA: Diagnosis not present

## 2022-09-14 DIAGNOSIS — K2100 Gastro-esophageal reflux disease with esophagitis, without bleeding: Secondary | ICD-10-CM | POA: Diagnosis not present

## 2022-10-14 DIAGNOSIS — C61 Malignant neoplasm of prostate: Secondary | ICD-10-CM | POA: Diagnosis not present

## 2022-10-21 DIAGNOSIS — C61 Malignant neoplasm of prostate: Secondary | ICD-10-CM | POA: Diagnosis not present

## 2022-11-16 DIAGNOSIS — H40011 Open angle with borderline findings, low risk, right eye: Secondary | ICD-10-CM | POA: Diagnosis not present

## 2022-12-08 DIAGNOSIS — H04123 Dry eye syndrome of bilateral lacrimal glands: Secondary | ICD-10-CM | POA: Diagnosis not present

## 2022-12-08 DIAGNOSIS — H25813 Combined forms of age-related cataract, bilateral: Secondary | ICD-10-CM | POA: Diagnosis not present

## 2022-12-08 DIAGNOSIS — H40003 Preglaucoma, unspecified, bilateral: Secondary | ICD-10-CM | POA: Diagnosis not present

## 2022-12-23 NOTE — H&P (Signed)
Surgical History & Physical  Patient Name: Daniel Atkinson  DOB: Oct 11, 1965  Surgery: Cataract extraction with intraocular lens implant phacoemulsification; Right Eye Surgeon: Pecolia Ades MD Surgery Date: 01/01/2023 Pre-Op Date: 12/08/2022  HPI: A 79 Yr. old male patient present for cataract/glaucoma eval per Dr. Earlene Plater. Patient states Dr. Earlene Plater explained she doesn't think his glaucoma medication is working well because of his cataracts blocking the area that the medication goes. States his vision has been worsening over time. 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 He doesn't wear his glasses all the time. He is using Brimonidine BID OS and Lumigan qhs OS (forgets Lumigan at times). He has noticed he has not been seeing as well OD. Vision isn't as clear x4-6 months.  Medical History: Glaucoma Cataracts hx retinal heme OS  Cancer High Blood Pressure acid reflux, anxiety  Review of Systems Cardiovascular High Blood Pressure Gastrointestinal acid reflux Psychiatry Anxiety All recorded systems are negative except as noted above.  Social Former smoker  Medication  Brimonidine, sucralfate ,  amlodipine ,  brimonidine ,  lisinopril ,  alprazolam,  Dexilant ,  latanoprost   Sx/Procedures None  Drug Allergies  hydrocodone   History & Physical: Heent: cataracts NECK: supple without bruits LUNGS: lungs clear to auscultation CV: regular rate and rhythm Abdomen: soft and non-tender  Impression & Plan: Assessment: 1.  CATARACT AGE-RELATED COMBINED FORMS; Both Eyes (H25.813) 2.  GLAUCOMA SUSPECT; Both Eyes (H40.003) 3.  Hyperopia ; Both Eyes (H52.03) 4.  DRY EYE SYNDROME/TEAR FILM INSUFFICIENCY; Both Eyes (H04.123)  Plan: 1.  Cataracts are visually significant and account for the patient's complaints. Discussed all risks, benefits, procedures and recovery, including infection, loss of vision and eye, need for glasses after surgery  or additional procedures. Patient understands changing glasses will not improve vision. Patient indicated understanding of procedure. All questions answered. Patient desires to have surgery, recommend phacoemulsification with intraocular lens. Patient to have preliminary testing necessary (Argos/IOL Master, Mac OCT, TOPO) Educational materials provided  Plan: - Proceed with cataract surgery OD - Considering surgery OS and hold spot for 4 weeks after OD - DIB00 with best distance target - good dilation - ? possible mild phacodonesis, take care with rhexis and lens removal - ok with lying flat, no DM, no fuchs  2.  Per Dr. Earlene Plater wth normal tension glaucoma suspect Pachy 560/579 Gonio open throughout OU IOPs 12-20 untreated Currently prescribed lumigan QHS OU and Brim BID OU but some difficulty with taking regularly OCT shows sup thinning OS and full OD HVF with Dr. Earlene Plater did show possible inferior arcuate changes OS  3.  Per Dr. Earlene Plater  4.  Findings, prognosis and treatment options reviewed. Begin basic treatment regimen; warm compresses, lid scrubs, 1000-2000mg  omega 3 fatty acids, and preserved artificial tears QID.

## 2022-12-25 DIAGNOSIS — H25811 Combined forms of age-related cataract, right eye: Secondary | ICD-10-CM | POA: Diagnosis not present

## 2022-12-29 ENCOUNTER — Encounter (HOSPITAL_COMMUNITY)
Admission: RE | Admit: 2022-12-29 | Discharge: 2022-12-29 | Disposition: A | Discharge status: Home / Self Care | Payer: BC Managed Care – PPO | Source: Ambulatory Visit | Attending: Optometry | Admitting: Optometry

## 2022-12-30 ENCOUNTER — Encounter (HOSPITAL_COMMUNITY): Payer: Self-pay

## 2023-01-01 ENCOUNTER — Ambulatory Visit (HOSPITAL_COMMUNITY): Payer: BC Managed Care – PPO | Admitting: Certified Registered Nurse Anesthetist

## 2023-01-01 ENCOUNTER — Encounter (HOSPITAL_COMMUNITY): Payer: Self-pay | Admitting: Optometry

## 2023-01-01 ENCOUNTER — Encounter (HOSPITAL_COMMUNITY)
Admission: RE | Disposition: A | Discharge status: Home / Self Care | Payer: Self-pay | Source: Home / Self Care | Attending: Optometry

## 2023-01-01 ENCOUNTER — Ambulatory Visit (HOSPITAL_COMMUNITY)
Admission: RE | Admit: 2023-01-01 | Discharge: 2023-01-01 | Disposition: A | Discharge status: Home / Self Care | Payer: BC Managed Care – PPO | Attending: Optometry | Admitting: Optometry

## 2023-01-01 DIAGNOSIS — H5203 Hypermetropia, bilateral: Secondary | ICD-10-CM | POA: Diagnosis not present

## 2023-01-01 DIAGNOSIS — H40003 Preglaucoma, unspecified, bilateral: Secondary | ICD-10-CM | POA: Insufficient documentation

## 2023-01-01 DIAGNOSIS — Z87891 Personal history of nicotine dependence: Secondary | ICD-10-CM | POA: Diagnosis not present

## 2023-01-01 DIAGNOSIS — H04123 Dry eye syndrome of bilateral lacrimal glands: Secondary | ICD-10-CM | POA: Insufficient documentation

## 2023-01-01 DIAGNOSIS — H2511 Age-related nuclear cataract, right eye: Secondary | ICD-10-CM | POA: Insufficient documentation

## 2023-01-01 DIAGNOSIS — H25811 Combined forms of age-related cataract, right eye: Secondary | ICD-10-CM | POA: Diagnosis not present

## 2023-01-01 DIAGNOSIS — I10 Essential (primary) hypertension: Secondary | ICD-10-CM | POA: Diagnosis not present

## 2023-01-01 HISTORY — PX: CATARACT EXTRACTION W/PHACO: SHX586

## 2023-01-01 SURGERY — PHACOEMULSIFICATION, CATARACT, WITH IOL INSERTION
Anesthesia: Monitor Anesthesia Care | Site: Eye | Laterality: Right

## 2023-01-01 MED ORDER — BSS IO SOLN
INTRAOCULAR | Status: DC | PRN
  Administered 2023-01-01: 15 mL via INTRAOCULAR

## 2023-01-01 MED ORDER — LIDOCAINE HCL 3.5 % OP GEL
1.0000 | Freq: Once | OPHTHALMIC | Status: AC
  Administered 2023-01-01: 1 via OPHTHALMIC

## 2023-01-01 MED ORDER — PHENYLEPHRINE-KETOROLAC 1-0.3 % IO SOLN
INTRAOCULAR | Status: AC
  Filled 2023-01-01: qty 4

## 2023-01-01 MED ORDER — MOXIFLOXACIN HCL 5 MG/ML IO SOLN
INTRAOCULAR | Status: AC
  Filled 2023-01-01: qty 1

## 2023-01-01 MED ORDER — STERILE WATER FOR IRRIGATION IR SOLN
Status: DC | PRN
  Administered 2023-01-01: 250 mL

## 2023-01-01 MED ORDER — TROPICAMIDE 1 % OP SOLN
1.0000 [drp] | OPHTHALMIC | Status: AC
  Administered 2023-01-01 (×3): 1 [drp] via OPHTHALMIC

## 2023-01-01 MED ORDER — PHENYLEPHRINE HCL 2.5 % OP SOLN
1.0000 [drp] | OPHTHALMIC | Status: AC
  Administered 2023-01-01 (×3): 1 [drp] via OPHTHALMIC

## 2023-01-01 MED ORDER — PHENYLEPHRINE-KETOROLAC 1-0.3 % IO SOLN
INTRAOCULAR | Status: DC | PRN
  Administered 2023-01-01: 500 mL via OPHTHALMIC

## 2023-01-01 MED ORDER — TETRACAINE HCL 0.5 % OP SOLN
1.0000 [drp] | OPHTHALMIC | Status: AC
  Administered 2023-01-01 (×3): 1 [drp] via OPHTHALMIC

## 2023-01-01 MED ORDER — LIDOCAINE HCL (PF) 1 % IJ SOLN
INTRAMUSCULAR | Status: DC | PRN
  Administered 2023-01-01: 2 mL

## 2023-01-01 MED ORDER — FENTANYL CITRATE (PF) 100 MCG/2ML IJ SOLN
INTRAMUSCULAR | Status: AC
  Filled 2023-01-01: qty 2

## 2023-01-01 MED ORDER — MOXIFLOXACIN HCL 5 MG/ML IO SOLN
INTRAOCULAR | Status: DC | PRN
  Administered 2023-01-01: .2 mL via INTRACAMERAL

## 2023-01-01 MED ORDER — MIDAZOLAM HCL 2 MG/2ML IJ SOLN
INTRAMUSCULAR | Status: AC
  Filled 2023-01-01: qty 2

## 2023-01-01 MED ORDER — SIGHTPATH DOSE#1 NA HYALUR & NA CHOND-NA HYALUR IO KIT
PACK | INTRAOCULAR | Status: DC | PRN
  Administered 2023-01-01: 1 via OPHTHALMIC

## 2023-01-01 MED ORDER — POVIDONE-IODINE 5 % OP SOLN
OPHTHALMIC | Status: DC | PRN
  Administered 2023-01-01: 1 via OPHTHALMIC

## 2023-01-01 SURGICAL SUPPLY — 14 items
CATARACT SUITE SIGHTPATH (MISCELLANEOUS) ×1
CLOTH BEACON ORANGE TIMEOUT ST (SAFETY) ×1 IMPLANT
DRSG TEGADERM 4X4.75 (GAUZE/BANDAGES/DRESSINGS) ×1 IMPLANT
EYE SHIELD UNIVERSAL CLEAR (GAUZE/BANDAGES/DRESSINGS) IMPLANT
FEE CATARACT SUITE SIGHTPATH (MISCELLANEOUS) ×1 IMPLANT
GLOVE BIOGEL PI IND STRL 7.0 (GLOVE) ×2 IMPLANT
LENS IOL TECNIS EYHANCE 23.5 (Intraocular Lens) IMPLANT
NDL HYPO 18GX1.5 BLUNT FILL (NEEDLE) ×1 IMPLANT
NEEDLE HYPO 18GX1.5 BLUNT FILL (NEEDLE) ×1
PAD ARMBOARD 7.5X6 YLW CONV (MISCELLANEOUS) ×1 IMPLANT
POSITIONER HEAD 8X9X4 ADT (SOFTGOODS) ×1 IMPLANT
SYR TB 1ML LL NO SAFETY (SYRINGE) ×1 IMPLANT
TAPE SURG TRANSPORE 1 IN (GAUZE/BANDAGES/DRESSINGS) IMPLANT
WATER STERILE IRR 250ML POUR (IV SOLUTION) ×1 IMPLANT

## 2023-01-01 NOTE — Discharge Instructions (Signed)
Please discharge patient when stable, will follow up today with Dr. Tod Abrahamsen at the [facility] office immediately following discharge.  Leave shield in place until visit.  All paperwork with discharge instructions will be given at the office.  [facility] Address:  [address]  [place], [place] [address]  Dr. Feliciano Wynter's Phone: [phone]  

## 2023-01-01 NOTE — Op Note (Signed)
Date of procedure: 01/01/23  Pre-operative diagnosis: Visually significant age-related nuclear cataract, Right Eye (H25.11)  Post-operative diagnosis: Visually significant age-related nuclear cataract, Right Eye  Procedure: Removal of cataract via phacoemulsification and insertion of intra-ocular lens J&J DIBOO +23.5D into the capsular bag of the Right Eye  Attending surgeon: Pecolia Ades, MD  Anesthesia: MAC, Topical Akten  Complications: None  Estimated Blood Loss: <104mL (minimal)  Specimens: None  Implants:  Implant Name Type Inv. Item Serial No. Manufacturer Lot No. LRB No. Used Action  LENS IOL TECNIS EYHANCE 23.5 - [id number] Intraocular Lens LENS IOL TECNIS EYHANCE 23.5 [id number] [employer]  Right 1 Implanted    Indications:  Visually significant age-related cataract, Right Eye  Procedure:  The patient was seen and identified in the pre-operative area. The operative eye was identified and dilated.  The operative eye was marked.  Topical anesthesia was administered to the operative eye.     The patient was then to the operative suite and placed in the supine position.  A timeout was performed confirming the patient, procedure to be performed, and all other relevant information.   The patient's face was prepped and draped in the usual fashion for intra-ocular surgery.  A lid speculum was placed into the operative eye and the surgical microscope moved into place and focused.  A superotemporal paracentesis was created using a 20 gauge paracentesis blade.  BSS mixed with Omidria, followed by 1% lidocaine was injected into the anterior chamber.  Viscoelastic was injected into the anterior chamber.  A temporal clear-corneal main wound incision was created using a 2.66mm microkeratome.  A continuous curvilinear capsulorrhexis was initiated using an irrigating cystitome and completed using capsulorrhexis forceps.  Hydrodissection and hydrodeliniation were performed.  Viscoelastic was  injected into the anterior chamber.  A phacoemulsification handpiece and a chopper as a second instrument were used to remove the nucleus and epinucleus. The irrigation/aspiration handpiece was used to remove any remaining cortical material.   The capsular bag was reinflated with viscoelastic, checked, and found to be intact.  The intraocular lens was inserted into the capsular bag.  The irrigation/aspiration handpiece was used to remove any remaining viscoelastic.  The clear corneal wound and paracentesis wounds were then hydrated and checked with Weck-Cels to be watertight. Moxifloxacin was instilled into the anterior chamber.  The lid-speculum and drape was removed, and the patient's face was cleaned with a wet and dry 4x4. A clear shield was taped over the eye. The patient was taken to the post-operative care unit in good condition, having tolerated the procedure well.  Post-Op Instructions: The patient will follow up at [facility] for a same day post-operative evaluation and will receive all other orders and instructions.

## 2023-01-01 NOTE — Anesthesia Postprocedure Evaluation (Signed)
Anesthesia Post Note  Patient: Daniel Atkinson  Procedure(s) Performed: CATARACT EXTRACTION PHACO AND INTRAOCULAR LENS PLACEMENT (IOC) (Right: Eye)  Patient location during evaluation: Phase II Anesthesia Type: MAC Level of consciousness: awake Pain management: pain level controlled Vital Signs Assessment: post-procedure vital signs reviewed and stable Respiratory status: spontaneous breathing and respiratory function stable Cardiovascular status: blood pressure returned to baseline and stable Postop Assessment: no headache and no apparent nausea or vomiting Anesthetic complications: no Comments: Late entry   No notable events documented.   Last Vitals:  Vitals:   01/01/23 0745 01/01/23 0854  BP: 109/80 119/85  Pulse: 67 69  Resp: 18 12  Temp: (!) 36.4 C 36.7 C  SpO2: 100% 100%    Last Pain:  Vitals:   01/01/23 0854  TempSrc: Oral  PainSc: 0-No pain                 Windell Norfolk

## 2023-01-01 NOTE — Anesthesia Preprocedure Evaluation (Signed)
Anesthesia Evaluation  Patient identified by MRN, date of birth, ID band Patient awake    Reviewed: Allergy & Precautions, H&P , NPO status , Patient's Chart, lab work & pertinent test results, reviewed documented beta blocker date and time   Airway Mallampati: II  TM Distance: >3 FB Neck ROM: full    Dental no notable dental hx.    Pulmonary neg pulmonary ROS, former smoker   Pulmonary exam normal breath sounds clear to auscultation       Cardiovascular Exercise Tolerance: Good hypertension, negative cardio ROS  Rhythm:regular Rate:Normal     Neuro/Psych  Headaches  Anxiety      Neuromuscular disease negative neurological ROS  negative psych ROS   GI/Hepatic negative GI ROS, Neg liver ROS, hiatal hernia,GERD  ,,  Endo/Other  negative endocrine ROS    Renal/GU negative Renal ROS  negative genitourinary   Musculoskeletal   Abdominal   Peds  Hematology negative hematology ROS (+)   Anesthesia Other Findings   Reproductive/Obstetrics negative OB ROS                             Anesthesia Physical Anesthesia Plan  ASA: 2  Anesthesia Plan: MAC   Post-op Pain Management:    Induction:   PONV Risk Score and Plan:   Airway Management Planned:   Additional Equipment:   Intra-op Plan:   Post-operative Plan:   Informed Consent: I have reviewed the patients History and Physical, chart, labs and discussed the procedure including the risks, benefits and alternatives for the proposed anesthesia with the patient or authorized representative who has indicated his/her understanding and acceptance.     Dental Advisory Given  Plan Discussed with: CRNA  Anesthesia Plan Comments:        Anesthesia Quick Evaluation

## 2023-01-01 NOTE — Interval H&P Note (Signed)
History and Physical Interval Note:  01/01/2023 7:52 AM  The H and P was reviewed and updated. The patient was examined.  No changes were found after exam.  The surgical eye was marked.  Daniel Atkinson

## 2023-01-01 NOTE — Transfer of Care (Signed)
Immediate Anesthesia Transfer of Care Note  Patient: Daniel Atkinson  Procedure(s) Performed: CATARACT EXTRACTION PHACO AND INTRAOCULAR LENS PLACEMENT (IOC) (Right: Eye)  Patient Location: Short Stay  Anesthesia Type:MAC  Level of Consciousness: awake, alert , and oriented  Airway & Oxygen Therapy: Patient Spontanous Breathing  Post-op Assessment: Report given to RN, Post -op Vital signs reviewed and stable, Patient moving all extremities X 4, and Patient able to stick tongue midline  Post vital signs: Reviewed and stable  Last Vitals:  Vitals Value Taken Time  BP 119/85   Temp 98.0   Pulse 76   Resp 14   SpO2 100     Last Pain:  Vitals:   01/01/23 0745  TempSrc: Oral  PainSc: 0-No pain      Patients Stated Pain Goal: 5 (01/01/23 0745)  Complications: No notable events documented.

## 2023-01-06 ENCOUNTER — Encounter: Payer: Self-pay | Admitting: Gastroenterology

## 2023-01-07 ENCOUNTER — Encounter (HOSPITAL_COMMUNITY): Payer: Self-pay | Admitting: Optometry

## 2023-01-22 DIAGNOSIS — H25812 Combined forms of age-related cataract, left eye: Secondary | ICD-10-CM | POA: Diagnosis not present

## 2023-01-27 ENCOUNTER — Encounter (HOSPITAL_COMMUNITY)
Admission: RE | Admit: 2023-01-27 | Discharge: 2023-01-27 | Disposition: A | Discharge status: Home / Self Care | Payer: BC Managed Care – PPO | Source: Ambulatory Visit | Attending: Optometry | Admitting: Optometry

## 2023-01-27 ENCOUNTER — Encounter (HOSPITAL_COMMUNITY): Payer: Self-pay

## 2023-01-27 ENCOUNTER — Other Ambulatory Visit: Payer: Self-pay

## 2023-01-28 DIAGNOSIS — F419 Anxiety disorder, unspecified: Secondary | ICD-10-CM | POA: Diagnosis not present

## 2023-01-28 NOTE — H&P (Signed)
Surgical History & Physical  Patient Name: Daniel Atkinson  DOB: 07-09-1965  Surgery: Cataract extraction with intraocular lens implant phacoemulsification; Left Eye Surgeon: Pecolia Ades MD Surgery Date: 01/29/2023 Pre-Op Date: 01/05/2023  HPI: A 24 Yr. old male patient present for same day post op OD. Patient is doing well and happy with results. Using Combo gtt TID OD, Brimonidine 1-2x/day OS, and Latanoprost qhs OU. Having glare problems when driving during the day and at night. Difficulties reading fine print. This is negatively affecting the patient's quality of life and the patient is unable to function adequately in life with the current level of vision. Patient would like to proceed with cataract sx OS.  Medical History: Glaucoma Cataracts hx retinal heme OS Cancer High Blood Pressure acid reflux, anxiety  Review of Systems Cardiovascular High Blood Pressure Gastrointestinal acid reflux Psychiatry Anxiety All recorded systems are negative except as noted above.  Social Former smoker  Medication Brimonidine, Pred-moxi-brom, Latanoprost,  sucralfate ,  amlodipine ,  brimonidine ,  lisinopril ,  alprazolam ,  Dexilant ,  latanoprost   Sx/Procedures Phaco c IOL OD,   Drug Allergies  hydrocodone   History & Physical: Heent: cataract NECK: supple without bruits LUNGS: lungs clear to auscultation CV: regular rate and rhythm Abdomen: soft and non-tender  Impression & Plan: Assessment: 1.  CATARACT EXTRACTION STATUS; Right Eye (Z98.41) 2.  INTRAOCULAR LENS IOL ; Right Eye (Z96.1) 3.  CATARACT AGE-RELATED COMBINED FORMS; Both Eyes (H25.813)  Plan: 1.  POD4 exam. Doing well. All post-op precautions discussed and instructions reviewed. Written instructions given.  2.  Stable appearance.  3.  Cataracts are visually significant and account for the patient's complaints. Discussed all risks, benefits, procedures and recovery, including infection, loss of vision  and eye, need for glasses after surgery or additional procedures. Patient understands changing glasses will not improve vision. Patient indicated understanding of procedure. All questions answered. Patient desires to have surgery, recommend phacoemulsification with intraocular lens. Patient to have preliminary testing necessary (Argos/IOL Master, Mac OCT, TOPO) Educational materials provided  Plan: - Proceed with cataract surgery OS when reader - DIB00 with best distance target - good dilation - ? possible mild phacodonesis, take care with rhexis and lens removal - ok with lying flat, no DM, no fuchs

## 2023-01-29 ENCOUNTER — Ambulatory Visit (HOSPITAL_COMMUNITY)
Admission: RE | Admit: 2023-01-29 | Discharge: 2023-01-29 | Disposition: A | Discharge status: Home / Self Care | Payer: BC Managed Care – PPO | Attending: Optometry | Admitting: Optometry

## 2023-01-29 ENCOUNTER — Encounter (HOSPITAL_COMMUNITY)
Admission: RE | Disposition: A | Discharge status: Home / Self Care | Payer: Self-pay | Source: Home / Self Care | Attending: Optometry

## 2023-01-29 ENCOUNTER — Encounter (HOSPITAL_COMMUNITY): Payer: Self-pay | Admitting: Optometry

## 2023-01-29 ENCOUNTER — Other Ambulatory Visit: Payer: Self-pay

## 2023-01-29 ENCOUNTER — Ambulatory Visit (HOSPITAL_COMMUNITY): Payer: BC Managed Care – PPO | Admitting: Certified Registered"

## 2023-01-29 DIAGNOSIS — K219 Gastro-esophageal reflux disease without esophagitis: Secondary | ICD-10-CM | POA: Diagnosis not present

## 2023-01-29 DIAGNOSIS — Z87891 Personal history of nicotine dependence: Secondary | ICD-10-CM | POA: Diagnosis not present

## 2023-01-29 DIAGNOSIS — H2512 Age-related nuclear cataract, left eye: Secondary | ICD-10-CM | POA: Diagnosis not present

## 2023-01-29 DIAGNOSIS — F419 Anxiety disorder, unspecified: Secondary | ICD-10-CM | POA: Diagnosis not present

## 2023-01-29 DIAGNOSIS — I10 Essential (primary) hypertension: Secondary | ICD-10-CM | POA: Diagnosis not present

## 2023-01-29 DIAGNOSIS — H25812 Combined forms of age-related cataract, left eye: Secondary | ICD-10-CM | POA: Diagnosis not present

## 2023-01-29 HISTORY — PX: CATARACT EXTRACTION W/PHACO: SHX586

## 2023-01-29 SURGERY — PHACOEMULSIFICATION, CATARACT, WITH IOL INSERTION
Anesthesia: Monitor Anesthesia Care | Laterality: Left

## 2023-01-29 MED ORDER — MOXIFLOXACIN HCL 5 MG/ML IO SOLN
INTRAOCULAR | Status: DC | PRN
  Administered 2023-01-29: 3 mL via INTRACAMERAL

## 2023-01-29 MED ORDER — LACTATED RINGERS IV SOLN: INTRAVENOUS | Status: DC

## 2023-01-29 MED ORDER — TROPICAMIDE 1 % OP SOLN
1.0000 [drp] | OPHTHALMIC | Status: AC | PRN
Start: 2023-01-29 — End: 2023-01-29
  Administered 2023-01-29 (×3): 1 [drp] via OPHTHALMIC

## 2023-01-29 MED ORDER — SODIUM CHLORIDE 0.9% FLUSH
INTRAVENOUS | Status: DC | PRN
Start: 2023-01-29 — End: 2023-01-29
  Administered 2023-01-29: 5 mL via INTRAVENOUS

## 2023-01-29 MED ORDER — PHENYLEPHRINE HCL 2.5 % OP SOLN
1.0000 [drp] | OPHTHALMIC | Status: AC | PRN
  Administered 2023-01-29 (×3): 1 [drp] via OPHTHALMIC

## 2023-01-29 MED ORDER — MIDAZOLAM HCL 2 MG/2ML IJ SOLN
INTRAMUSCULAR | Status: AC
  Filled 2023-01-29: qty 2

## 2023-01-29 MED ORDER — LIDOCAINE HCL (PF) 1 % IJ SOLN
INTRAMUSCULAR | Status: DC | PRN
  Administered 2023-01-29: 1 mL

## 2023-01-29 MED ORDER — PHENYLEPHRINE-KETOROLAC 1-0.3 % IO SOLN
INTRAOCULAR | Status: DC | PRN
  Administered 2023-01-29: 500 mL via OPHTHALMIC

## 2023-01-29 MED ORDER — SIGHTPATH DOSE#1 NA HYALUR & NA CHOND-NA HYALUR IO KIT
PACK | INTRAOCULAR | Status: DC | PRN
  Administered 2023-01-29: 1 via OPHTHALMIC

## 2023-01-29 MED ORDER — TETRACAINE HCL 0.5 % OP SOLN
1.0000 [drp] | OPHTHALMIC | Status: AC | PRN
Start: 2023-01-29 — End: 2023-01-29
  Administered 2023-01-29 (×3): 1 [drp] via OPHTHALMIC

## 2023-01-29 MED ORDER — LIDOCAINE HCL 3.5 % OP GEL
1.0000 | Freq: Once | OPHTHALMIC | Status: AC
  Administered 2023-01-29: 1 via OPHTHALMIC

## 2023-01-29 MED ORDER — MIDAZOLAM HCL 2 MG/2ML IJ SOLN
INTRAMUSCULAR | Status: DC | PRN
Start: 2023-01-29 — End: 2023-01-29
  Administered 2023-01-29: 1 mg via INTRAVENOUS

## 2023-01-29 MED ORDER — STERILE WATER FOR IRRIGATION IR SOLN
Status: DC | PRN
  Administered 2023-01-29: 1

## 2023-01-29 MED ORDER — POVIDONE-IODINE 5 % OP SOLN
OPHTHALMIC | Status: DC | PRN
  Administered 2023-01-29: 1 via OPHTHALMIC

## 2023-01-29 MED ORDER — BSS IO SOLN
INTRAOCULAR | Status: DC | PRN
  Administered 2023-01-29: 15 mL via INTRAOCULAR

## 2023-01-29 SURGICAL SUPPLY — 15 items
CATARACT SUITE SIGHTPATH (MISCELLANEOUS) ×1
CLOTH BEACON ORANGE TIMEOUT ST (SAFETY) ×1 IMPLANT
DRSG TEGADERM 4X4.75 (GAUZE/BANDAGES/DRESSINGS) ×1 IMPLANT
EYE SHIELD UNIVERSAL CLEAR (GAUZE/BANDAGES/DRESSINGS) IMPLANT
FEE CATARACT SUITE SIGHTPATH (MISCELLANEOUS) ×1 IMPLANT
GLOVE BIOGEL PI IND STRL 7.0 (GLOVE) ×2 IMPLANT
GLOVE SURG SS PI 7.5 STRL IVOR (GLOVE) IMPLANT
LENS IOL TECNIS EYHANCE 23.5 (Intraocular Lens) IMPLANT
NDL HYPO 18GX1.5 BLUNT FILL (NEEDLE) ×1 IMPLANT
NEEDLE HYPO 18GX1.5 BLUNT FILL (NEEDLE) ×1
PAD ARMBOARD 7.5X6 YLW CONV (MISCELLANEOUS) ×1 IMPLANT
POSITIONER HEAD 8X9X4 ADT (SOFTGOODS) ×1 IMPLANT
SYR TB 1ML LL NO SAFETY (SYRINGE) ×1 IMPLANT
TAPE SURG TRANSPORE 1 IN (GAUZE/BANDAGES/DRESSINGS) IMPLANT
WATER STERILE IRR 250ML POUR (IV SOLUTION) ×1 IMPLANT

## 2023-01-29 NOTE — Anesthesia Postprocedure Evaluation (Signed)
Anesthesia Post Note  Patient: Daniel Atkinson  Procedure(s) Performed: CATARACT EXTRACTION PHACO AND INTRAOCULAR LENS PLACEMENT (IOC) (Left)  Patient location during evaluation: Phase II Anesthesia Type: MAC Level of consciousness: awake Pain management: pain level controlled Vital Signs Assessment: post-procedure vital signs reviewed and stable Respiratory status: spontaneous breathing and respiratory function stable Cardiovascular status: blood pressure returned to baseline and stable Postop Assessment: no headache and no apparent nausea or vomiting Anesthetic complications: no Comments: Late entry   No notable events documented.   Last Vitals:  Vitals:   01/29/23 0940 01/29/23 1058  BP: (!) 141/78 (!) 133/93  Pulse: 63 69  Resp: 18 14  Temp: 36.5 C 36.4 C  SpO2: 100% 100%    Last Pain:  Vitals:   01/29/23 1058  TempSrc: Oral  PainSc: 0-No pain                 Windell Norfolk

## 2023-01-29 NOTE — Anesthesia Preprocedure Evaluation (Signed)
Anesthesia Evaluation  Patient identified by MRN, date of birth, ID band Patient awake    Reviewed: Allergy & Precautions, H&P , NPO status , Patient's Chart, lab work & pertinent test results, reviewed documented beta blocker date and time   Airway Mallampati: II  TM Distance: >3 FB Neck ROM: full    Dental no notable dental hx.    Pulmonary neg pulmonary ROS, former smoker   Pulmonary exam normal breath sounds clear to auscultation       Cardiovascular Exercise Tolerance: Good hypertension, negative cardio ROS  Rhythm:regular Rate:Normal     Neuro/Psych  Headaches  Anxiety      Neuromuscular disease negative neurological ROS  negative psych ROS   GI/Hepatic negative GI ROS, Neg liver ROS, hiatal hernia,GERD  ,,  Endo/Other  negative endocrine ROS    Renal/GU negative Renal ROS  negative genitourinary   Musculoskeletal   Abdominal   Peds  Hematology negative hematology ROS (+)   Anesthesia Other Findings   Reproductive/Obstetrics negative OB ROS                             Anesthesia Physical Anesthesia Plan  ASA: 2  Anesthesia Plan: MAC   Post-op Pain Management:    Induction:   PONV Risk Score and Plan:   Airway Management Planned:   Additional Equipment:   Intra-op Plan:   Post-operative Plan:   Informed Consent: I have reviewed the patients History and Physical, chart, labs and discussed the procedure including the risks, benefits and alternatives for the proposed anesthesia with the patient or authorized representative who has indicated his/her understanding and acceptance.     Dental Advisory Given  Plan Discussed with: CRNA  Anesthesia Plan Comments:        Anesthesia Quick Evaluation

## 2023-01-29 NOTE — Op Note (Signed)
Date of procedure: 01/29/23  Pre-operative diagnosis: Visually significant age-related nuclear cataract, Left Eye (H25.12)  Post-operative diagnosis: Visually significant age-related nuclear cataract, Left Eye  Procedure: Removal of cataract via phacoemulsification and insertion of intra-ocular lens J&J DIB00 +23.5D into the capsular bag of the Left Eye  Attending surgeon: Ronal Fear, MD  Anesthesia: MAC, Topical Akten  Complications: None  Estimated Blood Loss: <43mL (minimal)  Specimens: None  Implants:  Implant Name Type Inv. Item Serial No. Manufacturer Lot No. LRB No. Used Action  LENS IOL TECNIS EYHANCE 23.5 - [id number] Intraocular Lens LENS IOL TECNIS EYHANCE 23.5 [id number] [employer]  Left 1 Implanted    Indications:  Visually significant age-related cataract, Left Eye  Procedure:  The patient was seen and identified in the pre-operative area. The operative eye was identified and dilated.  The operative eye was marked.  Topical anesthesia was administered to the operative eye.     The patient was then to the operative suite and placed in the supine position.  A timeout was performed confirming the patient, procedure to be performed, and all other relevant information.   The patient's face was prepped and draped in the usual fashion for intra-ocular surgery.  A lid speculum was placed into the operative eye and the surgical microscope moved into place and focused.  An inferotemporal paracentesis was created using a 20 gauge paracentesis blade.  BSS mixed with Omidria, followed by 1% lidocaine was injected into the anterior chamber.  Viscoelastic was injected into the anterior chamber.  A temporal clear-corneal main wound incision was created using a 2.5mm microkeratome.  A continuous curvilinear capsulorrhexis was initiated using an irrigating cystitome and completed using capsulorrhexis forceps.  Hydrodissection and hydrodeliniation were performed.  Viscoelastic was  injected into the anterior chamber.  A phacoemulsification handpiece and a chopper as a second instrument were used to remove the nucleus and epinucleus. The irrigation/aspiration handpiece was used to remove any remaining cortical material.   The capsular bag was reinflated with viscoelastic, checked, and found to be intact.  The intraocular lens was inserted into the capsular bag.  The irrigation/aspiration handpiece was used to remove any remaining viscoelastic.  The clear corneal wound and paracentesis wounds were then hydrated and checked with Weck-Cels to be watertight. Moxifloxacin was instilled into the anterior chamber.  The lid-speculum and drape was removed, and the patient's face was cleaned with a wet and dry 4x4.  A clear shield was taped over the eye. The patient was taken to the post-operative care unit in good condition, having tolerated the procedure well.  Post-Op Instructions: The patient will follow up at [facility] for a same day post-operative evaluation and will receive all other orders and instructions.

## 2023-01-29 NOTE — Transfer of Care (Signed)
Immediate Anesthesia Transfer of Care Note  Patient: Daniel Atkinson  Procedure(s) Performed: CATARACT EXTRACTION PHACO AND INTRAOCULAR LENS PLACEMENT (IOC) (Left)  Patient Location: Short Stay  Anesthesia Type:MAC  Level of Consciousness: awake, alert , and oriented  Airway & Oxygen Therapy: Patient Spontanous Breathing  Post-op Assessment: Report given to RN and Post -op Vital signs reviewed and stable  Post vital signs: Reviewed and stable  Last Vitals:  Vitals Value Taken Time  BP 133/93 01/29/23 1058  Temp 36.4 C 01/29/23 1058  Pulse 69 01/29/23 1058  Resp 14 01/29/23 1058  SpO2 100 % 01/29/23 1058    Last Pain:  Vitals:   01/29/23 1058  TempSrc: Oral  PainSc: 0-No pain         Complications: No notable events documented.

## 2023-01-29 NOTE — Discharge Instructions (Signed)
Please discharge patient when stable, will follow up today with Dr. Tod Abrahamsen at the [facility] office immediately following discharge.  Leave shield in place until visit.  All paperwork with discharge instructions will be given at the office.  [facility] Address:  [address]  [place], [place] [address]  Dr. Feliciano Wynter's Phone: [phone]  

## 2023-01-29 NOTE — Interval H&P Note (Signed)
History and Physical Interval Note:  01/29/2023 9:32 AM  The H and P was reviewed and updated. The patient was examined.  No changes were found after exam.  The surgical eye was marked.    Daniel Atkinson

## 2023-01-29 NOTE — Anesthesia Procedure Notes (Signed)
Procedure Name: MAC Date/Time: 01/29/2023 10:29 AM  Performed by: Julian Reil, CRNAPre-anesthesia Checklist: Emergency Drugs available, Patient identified, Suction available and Patient being monitored Patient Re-evaluated:Patient Re-evaluated prior to induction Oxygen Delivery Method: Nasal cannula Placement Confirmation: positive ETCO2

## 2023-02-08 ENCOUNTER — Encounter (HOSPITAL_COMMUNITY): Payer: Self-pay | Admitting: Optometry

## 2023-02-11 ENCOUNTER — Encounter: Payer: Self-pay | Admitting: Gastroenterology

## 2023-02-11 ENCOUNTER — Ambulatory Visit: Payer: BC Managed Care – PPO | Admitting: Gastroenterology

## 2023-02-11 VITALS — BP 130/79 | HR 76 | Temp 98.6°F | Ht 69.0 in | Wt 152.0 lb

## 2023-02-11 DIAGNOSIS — K219 Gastro-esophageal reflux disease without esophagitis: Secondary | ICD-10-CM | POA: Diagnosis not present

## 2023-02-11 DIAGNOSIS — Z860101 Personal history of adenomatous and serrated colon polyps: Secondary | ICD-10-CM | POA: Diagnosis not present

## 2023-02-11 DIAGNOSIS — K5900 Constipation, unspecified: Secondary | ICD-10-CM | POA: Diagnosis not present

## 2023-02-11 MED ORDER — DEXILANT 60 MG PO CPDR: 60.0000 mg | DELAYED_RELEASE_CAPSULE | Freq: Every day | ORAL | 3 refills | Status: DC

## 2023-02-11 NOTE — Patient Instructions (Signed)
I am glad you are doing well!  Continue Dexilant once daily. I recommend taking Carafate only as needed.  Please call with any bleeding, abdominal pain, nausea, vomiting, decreased appetite, problems swallowing, or any concerns!  We will see you in 1 year!  I enjoyed seeing you again today! I value our relationship and want to provide genuine, compassionate, and quality care. You may receive a survey regarding your visit with me, and I welcome your feedback! Thanks so much for taking the time to complete this. I look forward to seeing you again.      Gelene Mink, PhD, ANP-BC [facility]

## 2023-02-11 NOTE — Progress Notes (Signed)
Gastroenterology Office Note     Primary Care Physician:  Elfredia Nevins, MD  Primary Gastroenterologist: Dr Jena Gauss    Chief Complaint   Chief Complaint  Patient presents with   Follow-up    Follow up on GERD     History of Present Illness   Daniel Atkinson is a 57 y.o. male presenting today with a history of chronic GERD, constipation, adenomas with next surveillance in 2026. Last seen in Nov 2023. Here for yearly follow-up.   No abdominal pain, N/V, changes in bowel habits, constipation, diarrhea, overt GI bleeding, GERD, dysphagia, unexplained weight loss, lack of appetite, unexplained weight gain.    Cup of coffee helps with BM. Takes Miralax very rarely.   EGD Sept 2016: mild chronic gastritis.  Colonoscopy 2021: tubular adenoma, 5 year surveillance Colonoscopy 2016: tubular adenoma   Past Medical History:  Diagnosis Date   Anxiety    Cancer [facility])    prostate   Essential hypertension    GERD (gastroesophageal reflux disease)    Headache    Hiatal hernia     Past Surgical History:  Procedure Laterality Date   APPENDECTOMY     BIOPSY N/A 11/22/2014   Procedure: BIOPSY;  Surgeon: Corbin Ade, MD;  Location: [facility] ORS;  Service: Endoscopy;  Laterality: N/A;  gastric,    CATARACT EXTRACTION W/PHACO Right 01/01/2023   Procedure: CATARACT EXTRACTION PHACO AND INTRAOCULAR LENS PLACEMENT (IOC);  Surgeon: Pecolia Ades, MD;  Location: [facility] ORS;  Service: Ophthalmology;  Laterality: Right;  CDE: 1.38   CATARACT EXTRACTION W/PHACO Left 01/29/2023   Procedure: CATARACT EXTRACTION PHACO AND INTRAOCULAR LENS PLACEMENT (IOC);  Surgeon: Pecolia Ades, MD;  Location: [facility] ORS;  Service: Ophthalmology;  Laterality: Left;  CDE: 3.14   COLONOSCOPY WITH PROPOFOL N/A 11/22/2014   RMR: single colonic polyp removed, tubular adenoma   COLONOSCOPY WITH PROPOFOL N/A 12/14/2019   one 5 mm polyp (tubular adenoma). 5 year surveillance.   ESOPHAGOGASTRODUODENOSCOPY (EGD) WITH  PROPOFOL N/A 11/22/2014   Dr. Rourk:abnormal  gastric mucosa of doubtful clinical significance status post biopsy. mild chronic gastritis.    POLYPECTOMY N/A 11/22/2014   Procedure: POLYPECTOMY;  Surgeon: Corbin Ade, MD;  Location: [facility]S;  Service: Endoscopy;  Laterality: N/A;  ascending colon    POLYPECTOMY  12/14/2019   Procedure: POLYPECTOMY;  Surgeon: Corbin Ade, MD;  Location: [facility] ENDO SU[facility];  Service: Endoscopy;;    Current Outpatient Medications  Medication Sig Dispense Refill   ALPRAZolam (XANAX) 1 MG tablet Take 1 mg by mouth at bedtime.     amLODipine (NORVASC) 5 MG tablet Take 5 mg by mouth daily.     bimatoprost (LUMIGAN) 0.01 % SOLN 1 drop at bedtime.     DEXILANT 60 MG capsule Take 1 capsule (60 mg total) by mouth daily. 90 capsule 3   docusate sodium (COLACE) 100 MG capsule Take 100 mg by mouth daily.     lisinopril (ZESTRIL) 20 MG tablet Take 20 mg by mouth daily.     polyethylene glycol (MIRALAX / GLYCOLAX) 17 g packet Take 17 g by mouth daily as needed (constipation.).     sucralfate (CARAFATE) 1 g tablet TAKE 1 TABLET BY MOUTH FOUR TIMES DAILY BEFORE MEALS AND AT BEDTIME AS NEEDED FOR REFLUX (Patient taking differently: Take 1 g by mouth 4 (four) times daily as needed (reflux.).) 360 tablet 0   tadalafil (CIALIS) 20 MG tablet Take 1 tablet (20 mg total) by mouth as needed for erectile  dysfunction. 30 tablet 11   Multiple Vitamin (MULTIVITAMIN WITH MINERALS) TABS tablet Take 1 tablet by mouth daily. (Patient not taking: Reported on 02/11/2023)     No current facility-administered medications for this visit.    Allergies as of 02/11/2023 - Review Complete 02/11/2023  Allergen Reaction Noted   Hydrocodone Nausea And Vomiting 08/09/2014    Family History  Problem Relation Age of Onset   Hypertension Mother    Hypertension Father    CAD Father        Died age 59   Colon cancer Neg Hx     Social History   Socioeconomic History   Marital status: Married     Spouse name: Not on file   Number of children: Not on file   Years of education: Not on file   Highest education level: Not on file  Occupational History   Not on file  Tobacco Use   Smoking status: Former    Current packs/day: 0.00    Average packs/day: 1 pack/day for 15.0 years (15.0 ttl pk-yrs)    Types: Cigarettes    Start date: 11/15/1987    Quit date: 11/15/2002    Years since quitting: 20.2   Smokeless tobacco: Never   Tobacco comments:    Quit x 12 years  Vaping Use   Vaping status: Never Used  Substance and Sexual Activity   Alcohol use: No    Alcohol/week: 0.0 standard drinks of alcohol   Drug use: No   Sexual activity: Yes    Birth control/protection: None  Daniel Topics Concern   Not on file  Social History Narrative   Not on file   Social Determinants of Health   Financial Resource Strain: Low Risk  (04/06/2022)   Overall Financial Resource Strain (CARDIA)    Difficulty of Paying Living Expenses: Not very hard  Food Insecurity: No Food Insecurity (04/06/2022)   Hunger Vital Sign    Worried About Running Out of Food in the Last Year: Never true    Ran Out of Food in the Last Year: Never true  Transportation Needs: No Transportation Needs (04/06/2022)   PRAPARE - Administrator, Civil Service (Medical): No    Lack of Transportation (Non-Medical): No  Physical Activity: Unknown (04/19/2018)   Received from [facility], [facility]   Exercise Vital Sign    Days of Exercise per Week: Patient declined    Minutes of Exercise per Session: Patient declined  Stress: Stress Concern Present (04/19/2018)   Received from [facility], [facility] - Occupational Stress Questionnaire    Feeling of Stress : To some extent  Social Connections: Unknown (04/19/2018)   Received from [facility], [facility]   Social Connection and Isolation Panel [NHANES]    Frequency of Communication with Friends  and Family: Patient declined    Frequency of Social Gatherings with Friends and Family: Patient declined    Attends Religious Services: Patient declined    Database administrator or Organizations: Patient declined    Attends Banker Meetings: Patient declined    Marital Status: Patient declined  Intimate Partner Violence: Unknown (04/19/2018)   Received from [facility], [facility]   Humiliation, Afraid, Rape, and Kick questionnaire    Fear of Current or Ex-Partner: Patient declined    Emotionally Abused: Patient declined    Physically Abused: Patient declined    Sexually Abused:  Patient declined     Review of Systems   Gen: Denies any fever, chills, fatigue, weight loss, lack of appetite.  CV: Denies chest pain, heart palpitations, peripheral edema, syncope.  Resp: Denies shortness of breath at rest or with exertion. Denies wheezing or cough.  GI: Denies dysphagia or odynophagia. Denies jaundice, hematemesis, fecal incontinence. GU : Denies urinary burning, urinary frequency, urinary hesitancy MS: Denies joint pain, muscle weakness, cramps, or limitation of movement.  Derm: Denies rash, itching, dry skin Psych: Denies depression, anxiety, memory loss, and confusion Heme: Denies bruising, bleeding, and enlarged lymph nodes.   Physical Exam   BP 130/79   Pulse 76   Temp 98.6 F (37 C)   Ht 5\' 9"  (1.753 m)   Wt 152 lb (68.9 kg)   BMI 22.45 kg/m  General:   Alert and oriented. Pleasant and cooperative. Well-nourished and well-developed.  Head:  Normocephalic and atraumatic. Eyes:  Without icterus Abdomen:  +BS, soft, non-tender and non-distended. No HSM noted. No guarding or rebound. No masses appreciated.  Rectal:  Deferred  Msk:  Symmetrical without gross deformities. Normal posture. Extremities:  Without edema. Neurologic:  Alert and  oriented x4;  grossly normal neurologically. Skin:  Intact without significant lesions or rashes. Psych:  Alert and  cooperative. Normal mood and affect.   Assessment   Daniel Atkinson is a 57 y.o. male presenting today with a history of  chronic GERD, constipation, adenomas with next surveillance in 2026 for routine yearly follow-up.  GERD: doing well on name-brand Dexilant. Unable to tolerate generic. No concerning signs/symptoms.   Constipation: very rarely using Miralax. Dietary measures controlling overall.   Hx of adenomas: due in 2026 for colonoscopy.   PLAN    Dexilant refilled Use carafate sparingly Return in 1 year Colonoscopy 2026   Gelene Mink, PhD, [facility]

## 2023-03-23 ENCOUNTER — Other Ambulatory Visit: Payer: Self-pay | Admitting: Gastroenterology

## 2023-03-30 NOTE — Progress Notes (Addendum)
 History of Present Illness:   TRUS/Bx on 12.3.2019. PSA 3.8, prostatic volume 15 mL, PSA density 0.25. 7/12 cores were positive for adenocarcinoma:  4 cores revealed GS 3+3 pattern (right base lateral, right mid lateral, right apex medial, left apex medial)  3 cores revealed GS 3+4 pattern (left base lateral, left mid medial, left apex lateral)   IPSS--13 quality of Life score 4  Shim score-- 14   3.2.2020: He is here for placement of fiducial markers and SpaceOAR.   5.20.2020: Completed 44 EBRT treatments.   1.14.2025: Most recent PSA in July was 0.5.  He has had no real lower urinary tract symptoms.  He has had no blood in his urine or stool.  He is on Cialis  for ED.  Past Medical History:  Diagnosis Date   Anxiety    Cancer [facility])    prostate   Essential hypertension    GERD (gastroesophageal reflux disease)    Headache    Hiatal hernia     Past Surgical History:  Procedure Laterality Date   APPENDECTOMY     BIOPSY N/A 11/22/2014   Procedure: BIOPSY;  Surgeon: Lamar CHRISTELLA Hollingshead, MD;  Location: [facility];  Service: Endoscopy;  Laterality: N/A;  gastric,    CATARACT EXTRACTION W/PHACO Right 01/01/2023   Procedure: CATARACT EXTRACTION PHACO AND INTRAOCULAR LENS PLACEMENT (IOC);  Surgeon: Juli Blunt, MD;  Location: [facility];  Service: Ophthalmology;  Laterality: Right;  CDE: 1.38   CATARACT EXTRACTION W/PHACO Left 01/29/2023   Procedure: CATARACT EXTRACTION PHACO AND INTRAOCULAR LENS PLACEMENT (IOC);  Surgeon: Juli Blunt, MD;  Location: [facility];  Service: Ophthalmology;  Laterality: Left;  CDE: 3.14   COLONOSCOPY WITH PROPOFOL  N/A 11/22/2014   RMR: single colonic polyp removed, tubular adenoma   COLONOSCOPY WITH PROPOFOL  N/A 12/14/2019   one 5 mm polyp (tubular adenoma). 5 year surveillance.   ESOPHAGOGASTRODUODENOSCOPY (EGD) WITH PROPOFOL  N/A 11/22/2014   Dr. Rourk:abnormal  gastric mucosa of doubtful clinical significance status post biopsy. mild chronic gastritis.     POLYPECTOMY N/A 11/22/2014   Procedure: POLYPECTOMY;  Surgeon: Lamar CHRISTELLA Hollingshead, MD;  Location: [facility];  Service: Endoscopy;  Laterality: N/A;  ascending colon    POLYPECTOMY  12/14/2019   Procedure: POLYPECTOMY;  Surgeon: Hollingshead Lamar CHRISTELLA, MD;  Location: [facility];  Service: Endoscopy;;    Home Medications:  Allergies as of 04/06/2023       Reactions   Hydrocodone Nausea And Vomiting        Medication List        Accurate as of March 30, 2023 12:39 PM. If you have any questions, ask your nurse or doctor.          ALPRAZolam 1 MG tablet Commonly known as: XANAX Take 1 mg by mouth at bedtime.   amLODipine 5 MG tablet Commonly known as: NORVASC Take 5 mg by mouth daily.   bimatoprost 0.01 % Soln Commonly known as: LUMIGAN 1 drop at bedtime.   Dexilant  60 MG capsule Generic drug: dexlansoprazole  TAKE 1 CAPSULE(60 MG) BY MOUTH DAILY BEFORE BREAKFAST   docusate sodium 100 MG capsule Commonly known as: COLACE Take 100 mg by mouth daily.   lisinopril  20 MG tablet Commonly known as: ZESTRIL  Take 20 mg by mouth daily.   polyethylene glycol 17 g packet Commonly known as: MIRALAX  / GLYCOLAX  Take 17 g by mouth daily as needed (constipation.).   sucralfate  1 g tablet Commonly known as: CARAFATE  TAKE 1 TABLET BY MOUTH FOUR TIMES DAILY BEFORE  MEALS AND AT BEDTIME AS NEEDED FOR REFLUX What changed:  how much to take how to take this when to take this reasons to take this additional instructions   tadalafil  20 MG tablet Commonly known as: CIALIS  Take 1 tablet (20 mg total) by mouth as needed for erectile dysfunction.        Allergies:  Allergies  Allergen Reactions   Hydrocodone Nausea And Vomiting    Family History  Problem Relation Age of Onset   Hypertension Mother    Hypertension Father    CAD Father        Died age 76   Colon cancer Neg Hx     Social History:  reports that he quit smoking about 20 years ago. His smoking use included  cigarettes. He started smoking about 35 years ago. He has a 15 pack-year smoking history. He has never used smokeless tobacco. He reports that he does not drink alcohol and does not use drugs.  ROS: A complete review of systems was performed.  All systems are negative except for pertinent findings as noted.  Physical Exam:  Vital signs in last 24 hours: There were no vitals taken for this visit. Constitutional:  Alert and oriented, No acute distress Cardiovascular: Regular rate  Respiratory: Normal respiratory effort Genitourinary: Normal anal sphincter tone.  Prostate smooth/flat.  No nodularity. Neurologic: Grossly intact, no focal deficits Psychiatric: Normal mood and affect  I have reviewed prior pt notes  I have reviewed urinalysis results  I have independently reviewed prior imaging-prostate ultrasound results  I have reviewed prior PSA and path results  Last radiation oncology note reviewed    Impression/Assessment:  1.  Grade group 2 prostate cancer, status post external beam radiotherapy with continued excellent durable PSA response  2.  ED, doing well with tadalafil   Plan:  1.  I will check his PSA today  2.  Office visit in 1 year for recheck  3.  Refill Cialis 

## 2023-04-06 ENCOUNTER — Encounter: Payer: Self-pay | Admitting: Urology

## 2023-04-06 ENCOUNTER — Ambulatory Visit: Payer: BC Managed Care – PPO | Admitting: Urology

## 2023-04-06 VITALS — BP 144/85 | HR 103

## 2023-04-06 DIAGNOSIS — N5201 Erectile dysfunction due to arterial insufficiency: Secondary | ICD-10-CM

## 2023-04-06 DIAGNOSIS — Z8546 Personal history of malignant neoplasm of prostate: Secondary | ICD-10-CM

## 2023-04-06 LAB — URINALYSIS, ROUTINE W REFLEX MICROSCOPIC
Bilirubin, UA: NEGATIVE
Glucose, UA: NEGATIVE
Ketones, UA: NEGATIVE
Leukocytes,UA: NEGATIVE
Nitrite, UA: NEGATIVE
Protein,UA: NEGATIVE
RBC, UA: NEGATIVE
Specific Gravity, UA: 1.01 (ref 1.005–1.030)
Urobilinogen, Ur: 2 mg/dL — ABNORMAL HIGH (ref 0.2–1.0)
pH, UA: 7.5 (ref 5.0–7.5)

## 2023-04-06 MED ORDER — TADALAFIL 20 MG PO TABS: 20.0000 mg | ORAL_TABLET | ORAL | 11 refills | Status: AC | PRN

## 2023-04-07 LAB — PSA: Prostate Specific Ag, Serum: 0.4 ng/mL (ref 0.0–4.0)

## 2023-04-12 ENCOUNTER — Encounter: Payer: Self-pay | Admitting: Urology

## 2023-04-19 ENCOUNTER — Telehealth: Payer: Self-pay

## 2023-04-19 NOTE — Telephone Encounter (Signed)
Called Pt to relay message from MD Dahlstedt "let him know that his PSA was 0.4, good news. " LVM

## 2023-04-20 DIAGNOSIS — H401121 Primary open-angle glaucoma, left eye, mild stage: Secondary | ICD-10-CM | POA: Diagnosis not present

## 2023-04-21 NOTE — Telephone Encounter (Signed)
Pt called and LVM to call back Pt just wanted to know what was left on previous VM

## 2023-06-28 DIAGNOSIS — H401121 Primary open-angle glaucoma, left eye, mild stage: Secondary | ICD-10-CM | POA: Diagnosis not present

## 2023-09-21 DIAGNOSIS — C61 Malignant neoplasm of prostate: Secondary | ICD-10-CM | POA: Diagnosis not present

## 2023-09-22 DIAGNOSIS — F419 Anxiety disorder, unspecified: Secondary | ICD-10-CM | POA: Diagnosis not present

## 2023-09-22 DIAGNOSIS — I10 Essential (primary) hypertension: Secondary | ICD-10-CM | POA: Diagnosis not present

## 2023-09-22 DIAGNOSIS — Z0001 Encounter for general adult medical examination with abnormal findings: Secondary | ICD-10-CM | POA: Diagnosis not present

## 2023-09-22 DIAGNOSIS — Z1389 Encounter for screening for other disorder: Secondary | ICD-10-CM | POA: Diagnosis not present

## 2023-09-22 DIAGNOSIS — K2100 Gastro-esophageal reflux disease with esophagitis, without bleeding: Secondary | ICD-10-CM | POA: Diagnosis not present

## 2023-09-22 DIAGNOSIS — Z6822 Body mass index (BMI) 22.0-22.9, adult: Secondary | ICD-10-CM | POA: Diagnosis not present

## 2023-09-22 DIAGNOSIS — Z1331 Encounter for screening for depression: Secondary | ICD-10-CM | POA: Diagnosis not present

## 2023-09-22 DIAGNOSIS — Z9229 Personal history of other drug therapy: Secondary | ICD-10-CM | POA: Diagnosis not present

## 2023-09-28 ENCOUNTER — Other Ambulatory Visit: Payer: Self-pay

## 2023-09-28 DIAGNOSIS — K219 Gastro-esophageal reflux disease without esophagitis: Secondary | ICD-10-CM

## 2023-09-28 MED ORDER — DEXLANSOPRAZOLE 60 MG PO CPDR: 60.0000 mg | DELAYED_RELEASE_CAPSULE | Freq: Every day | ORAL | 2 refills | Status: AC

## 2023-10-05 DIAGNOSIS — C61 Malignant neoplasm of prostate: Secondary | ICD-10-CM | POA: Diagnosis not present

## 2024-01-05 DIAGNOSIS — H401221 Low-tension glaucoma, left eye, mild stage: Secondary | ICD-10-CM | POA: Diagnosis not present

## 2024-01-06 ENCOUNTER — Encounter: Payer: Self-pay | Admitting: Gastroenterology

## 2024-02-07 DIAGNOSIS — F419 Anxiety disorder, unspecified: Secondary | ICD-10-CM | POA: Diagnosis not present

## 2024-02-07 DIAGNOSIS — I10 Essential (primary) hypertension: Secondary | ICD-10-CM | POA: Diagnosis not present

## 2024-04-09 NOTE — Progress Notes (Unsigned)
 "    Impression/Assessment:  1.  Grade group 2 prostate cancer, status post external beam radiotherapy with continued excellent durable PSA response.  PSA to be checked today  2.  ED, doing well with tadalafil   Plan:  1.  I will check his PSA today  2.  Continue yearly follow-up with Dr. Sherrilee in 1 year     History of Present Illness:   TRUS/Bx on 12.3.2019. PSA 3.8, prostatic volume 15 mL, PSA density 0.25. 7/12 cores were positive for adenocarcinoma:  4 cores revealed GS 3+3 pattern (right base lateral, right mid lateral, right apex medial, left apex medial)  3 cores revealed GS 3+4 pattern (left base lateral, left mid medial, left apex lateral)   IPSS--13 quality of Life score 4  Shim score-- 14   3.2.2020: He is here for placement of fiducial markers and [employer].   5.20.2020: Completed 44 EBRT treatments.   1.14.2025: PSA 0.4.  He has had no real lower urinary tract symptoms.  He has had no blood in his urine or stool.  He is on Cialis  for ED.  1.20.2026: Here for annual check. No PSA since last year.  No real voiding complaints.  IPSS 5/0.  No blood in stool or in urine.  Uses Cialis  occasionally.  Does not need refill.  Past Medical History:  Diagnosis Date   Anxiety    Cancer [facility])    prostate   Essential hypertension    GERD (gastroesophageal reflux disease)    Headache    Hiatal hernia     Past Surgical History:  Procedure Laterality Date   APPENDECTOMY     BIOPSY N/A 11/22/2014   Procedure: BIOPSY;  Surgeon: Lamar CHRISTELLA Hollingshead, MD;  Location: [facility];  Service: Endoscopy;  Laterality: N/A;  gastric,    CATARACT EXTRACTION W/PHACO Right 01/01/2023   Procedure: CATARACT EXTRACTION PHACO AND INTRAOCULAR LENS PLACEMENT (IOC);  Surgeon: Juli Blunt, MD;  Location: [facility];  Service: Ophthalmology;  Laterality: Right;  CDE: 1.38   CATARACT EXTRACTION W/PHACO Left 01/29/2023   Procedure: CATARACT EXTRACTION PHACO AND INTRAOCULAR LENS PLACEMENT (IOC);   Surgeon: Juli Blunt, MD;  Location: [facility];  Service: Ophthalmology;  Laterality: Left;  CDE: 3.14   COLONOSCOPY WITH PROPOFOL  N/A 11/22/2014   RMR: single colonic polyp removed, tubular adenoma   COLONOSCOPY WITH PROPOFOL  N/A 12/14/2019   one 5 mm polyp (tubular adenoma). 5 year surveillance.   ESOPHAGOGASTRODUODENOSCOPY (EGD) WITH PROPOFOL  N/A 11/22/2014   Dr. Rourk:abnormal  gastric mucosa of doubtful clinical significance status post biopsy. mild chronic gastritis.    POLYPECTOMY N/A 11/22/2014   Procedure: POLYPECTOMY;  Surgeon: Lamar CHRISTELLA Hollingshead, MD;  Location: [facility];  Service: Endoscopy;  Laterality: N/A;  ascending colon    POLYPECTOMY  12/14/2019   Procedure: POLYPECTOMY;  Surgeon: Hollingshead Lamar CHRISTELLA, MD;  Location: [facility];  Service: Endoscopy;;    Home Medications:  Allergies as of 04/11/2024       Reactions   Hydrocodone Nausea And Vomiting        Medication List        Accurate as of April 09, 2024  8:32 AM. If you have any questions, ask your nurse or doctor.          ALPRAZolam 1 MG tablet Commonly known as: XANAX Take 1 mg by mouth at bedtime.   amLODipine 5 MG tablet Commonly known as: NORVASC Take 5 mg by mouth daily.   bimatoprost 0.01 % Soln Commonly known as:  LUMIGAN 1 drop at bedtime.   dexlansoprazole  60 MG capsule Commonly known as: Dexilant  Take 1 capsule (60 mg total) by mouth daily.   docusate sodium 100 MG capsule Commonly known as: COLACE Take 100 mg by mouth daily.   lisinopril  20 MG tablet Commonly known as: ZESTRIL  Take 20 mg by mouth daily.   polyethylene glycol 17 g packet Commonly known as: MIRALAX  / GLYCOLAX  Take 17 g by mouth daily as needed (constipation.).   sucralfate  1 g tablet Commonly known as: CARAFATE  TAKE 1 TABLET BY MOUTH FOUR TIMES DAILY BEFORE MEALS AND AT BEDTIME AS NEEDED FOR REFLUX What changed:  how much to take how to take this when to take this reasons to take this additional  instructions   tadalafil  20 MG tablet Commonly known as: CIALIS  Take 1 tablet (20 mg total) by mouth as needed for erectile dysfunction.        Allergies:  Allergies  Allergen Reactions   Hydrocodone Nausea And Vomiting    Family History  Problem Relation Age of Onset   Hypertension Mother    Hypertension Father    CAD Father        Died age 47   Colon cancer Neg Hx     Social History:  reports that he quit smoking about 21 years ago. His smoking use included cigarettes. He started smoking about 36 years ago. He has a 15 pack-year smoking history. He has never used smokeless tobacco. He reports that he does not drink alcohol and does not use drugs.  ROS: A complete review of systems was performed.  All systems are negative except for pertinent findings as noted.  Physical Exam:  Vital signs in last 24 hours: There were no vitals taken for this visit. Constitutional:  Alert and oriented, No acute distress Cardiovascular: Regular rate  Respiratory: Normal respiratory effort Neurologic: Grossly intact, no focal deficits Psychiatric: Normal mood and affect  I have reviewed prior pt notes  I have reviewed urinalysis results--clear today  I have independently reviewed prior imaging-prostate ultrasound results  I have reviewed prior PSA and path results      "

## 2024-04-11 ENCOUNTER — Ambulatory Visit: Payer: BC Managed Care – PPO | Admitting: Urology

## 2024-04-11 VITALS — BP 122/76 | HR 76

## 2024-04-11 DIAGNOSIS — N5201 Erectile dysfunction due to arterial insufficiency: Secondary | ICD-10-CM

## 2024-04-11 DIAGNOSIS — N529 Male erectile dysfunction, unspecified: Secondary | ICD-10-CM

## 2024-04-11 DIAGNOSIS — Z8546 Personal history of malignant neoplasm of prostate: Secondary | ICD-10-CM

## 2024-04-11 LAB — URINALYSIS, ROUTINE W REFLEX MICROSCOPIC
Glucose, UA: NEGATIVE
Leukocytes,UA: NEGATIVE
Nitrite, UA: NEGATIVE
RBC, UA: NEGATIVE
Specific Gravity, UA: 1.025 (ref 1.005–1.030)
Urobilinogen, Ur: 4 mg/dL — ABNORMAL HIGH (ref 0.2–1.0)
pH, UA: 6 (ref 5.0–7.5)

## 2024-04-12 ENCOUNTER — Ambulatory Visit: Payer: Self-pay | Admitting: Urology

## 2024-04-12 LAB — PSA: Prostate Specific Ag, Serum: 0.6 ng/mL (ref 0.0–4.0)

## 2024-04-19 ENCOUNTER — Telehealth: Payer: Self-pay

## 2024-04-19 NOTE — Telephone Encounter (Signed)
 Called patient to give results per MD Dahlstedt, I got a message that the patient did not see his MyChart message from me.  Please call him and let him know PSA was stable at 0.6. patient made aware and voiced his understanding
